# Patient Record
Sex: Male | Born: 1960 | Hispanic: Yes | Marital: Married | Smoking: Former smoker
Health system: Southern US, Community
[De-identification: ages and names within clinical notes are randomized; demographics above are authoritative.]

## PROBLEM LIST (undated history)

## (undated) DIAGNOSIS — Z973 Presence of spectacles and contact lenses: Secondary | ICD-10-CM

## (undated) DIAGNOSIS — I10 Essential (primary) hypertension: Secondary | ICD-10-CM

## (undated) HISTORY — DX: Presence of spectacles and contact lenses: Z97.3

## (undated) HISTORY — DX: Essential (primary) hypertension: I10

---

## 1995-04-13 HISTORY — PX: HERNIA REPAIR: SHX51

## 2001-04-12 HISTORY — PX: VASECTOMY: SHX75

## 2015-05-03 ENCOUNTER — Ambulatory Visit (INDEPENDENT_AMBULATORY_CARE_PROVIDER_SITE_OTHER): Payer: BLUE CROSS/BLUE SHIELD | Admitting: Emergency Medicine

## 2015-05-03 VITALS — BP 138/80 | HR 64 | Temp 98.3°F | Resp 16 | Ht 68.0 in | Wt 215.0 lb

## 2015-05-03 DIAGNOSIS — Z125 Encounter for screening for malignant neoplasm of prostate: Secondary | ICD-10-CM | POA: Diagnosis not present

## 2015-05-03 DIAGNOSIS — Z13220 Encounter for screening for lipoid disorders: Secondary | ICD-10-CM | POA: Diagnosis not present

## 2015-05-03 DIAGNOSIS — Z1329 Encounter for screening for other suspected endocrine disorder: Secondary | ICD-10-CM | POA: Diagnosis not present

## 2015-05-03 DIAGNOSIS — Z0000 Encounter for general adult medical examination without abnormal findings: Secondary | ICD-10-CM | POA: Diagnosis not present

## 2015-05-03 DIAGNOSIS — Z1211 Encounter for screening for malignant neoplasm of colon: Secondary | ICD-10-CM

## 2015-05-03 DIAGNOSIS — I10 Essential (primary) hypertension: Secondary | ICD-10-CM

## 2015-05-03 DIAGNOSIS — Z130 Encounter for screening for diseases of the blood and blood-forming organs and certain disorders involving the immune mechanism: Secondary | ICD-10-CM | POA: Diagnosis not present

## 2015-05-03 DIAGNOSIS — Z1389 Encounter for screening for other disorder: Secondary | ICD-10-CM | POA: Diagnosis not present

## 2015-05-03 DIAGNOSIS — Z23 Encounter for immunization: Secondary | ICD-10-CM | POA: Diagnosis not present

## 2015-05-03 LAB — CBC
HEMATOCRIT: 46.6 % (ref 39.0–52.0)
HEMOGLOBIN: 15.4 g/dL (ref 13.0–17.0)
MCH: 29.2 pg (ref 26.0–34.0)
MCHC: 33 g/dL (ref 30.0–36.0)
MCV: 88.3 fL (ref 78.0–100.0)
MPV: 9.6 fL (ref 8.6–12.4)
Platelets: 267 10*3/uL (ref 150–400)
RBC: 5.28 MIL/uL (ref 4.22–5.81)
RDW: 13.1 % (ref 11.5–15.5)
WBC: 5.1 10*3/uL (ref 4.0–10.5)

## 2015-05-03 LAB — POCT URINALYSIS DIPSTICK
BILIRUBIN UA: NEGATIVE
Blood, UA: NEGATIVE
GLUCOSE UA: NEGATIVE
KETONES UA: NEGATIVE
Leukocytes, UA: NEGATIVE
Nitrite, UA: NEGATIVE
Protein, UA: NEGATIVE
SPEC GRAV UA: 1.02
Urobilinogen, UA: 0.2
pH, UA: 7.5

## 2015-05-03 LAB — LIPID PANEL
CHOL/HDL RATIO: 2.9 ratio (ref <=5.0)
CHOLESTEROL: 116 mg/dL — AB (ref 125–200)
HDL: 40 mg/dL (ref >=40)
LDL Cholesterol: 66 mg/dL (ref <130)
Triglycerides: 51 mg/dL (ref <150)
VLDL: 10 mg/dL (ref <30)

## 2015-05-03 LAB — COMPREHENSIVE METABOLIC PANEL
ALT: 18 U/L (ref 9–46)
AST: 17 U/L (ref 10–35)
Albumin: 4.3 g/dL (ref 3.6–5.1)
Alkaline Phosphatase: 76 U/L (ref 40–115)
BILIRUBIN TOTAL: 0.8 mg/dL (ref 0.2–1.2)
BUN: 10 mg/dL (ref 7–25)
CALCIUM: 9.3 mg/dL (ref 8.6–10.3)
CO2: 29 mmol/L (ref 20–31)
Chloride: 107 mmol/L (ref 98–110)
Creat: 0.98 mg/dL (ref 0.70–1.33)
GLUCOSE: 89 mg/dL (ref 65–99)
Potassium: 4.9 mmol/L (ref 3.5–5.3)
SODIUM: 141 mmol/L (ref 135–146)
Total Protein: 6.8 g/dL (ref 6.1–8.1)

## 2015-05-03 LAB — TSH: TSH: 0.96 u[IU]/mL (ref 0.350–4.500)

## 2015-05-03 LAB — POCT UA - MICROSCOPIC ONLY

## 2015-05-03 MED ORDER — LISINOPRIL 5 MG PO TABS: 5.0000 mg | ORAL_TABLET | Freq: Every day | ORAL | Status: DC

## 2015-05-03 NOTE — Progress Notes (Signed)
Subjective:    Patient ID: Jim [name], male    DOB: [birth year], 55 y.o.   MRN: [id number]  Chief Complaint  Patient presents with  . Annual Exam    Complete Physical   Medications, allergies, past medical history, surgical history, family history, social history and problem list reviewed and updated.  HPI  55 yom presents for CPE.   He works at [employer], has been there about 9 months. He is married with one step son, lives at home with his wife. He does not exercise but is very active daily at work. He does not follow any particular diet. Does eat large amount of vegetables and drinks good amount of water. He drinks rare soda. Does not drinks alcohol, use tobacco, or use illicit drugs. He received his flu vaccine earlier this year.   He denies cp, sob, dizziness, syncope, abd pain, n/v, diarrhea, fevers, or chills. He has never had a colonoscopy. Unsure of his last tdap date but thinks has been many years.   Diagnosed with htn several years ago, has been taking lisinopril for 2-3 years. This is the first time he's seen [place] here.   Review of Systems  ROS negative except those listed below.  Fatigue: Exhausted after 10 hour work days, stable.  Visual problems: Reading glasses.  Urinary frequency: Ongoing for years, thinks he drinks lot of water. Gets up 3 times per night to urinate.  Seasonal allergies: Takes zyrtec/flonase prn which helps.  Headaches: Not every day, periodically. No particular time of day, sometimes after work.  Sleep disturbance: Trouble falling asleep at time when mind is racing. Stable.      Objective:   Physical Exam  Constitutional: He is oriented to person, place, and time. He appears well-developed and well-nourished.  Non-toxic appearance. He does not have a sickly appearance. He does not appear ill. No distress.  BP 138/80 mmHg  Pulse 64  Temp(Src) 98.3 F (36.8 C) (Oral)  Resp 16  Ht 5\' 8"  (1.727 m)  Wt 215 lb (97.523 kg)  BMI 32.70  kg/m2  SpO2 99%   HENT:  Right Ear: Tympanic membrane normal.  Left Ear: Tympanic membrane normal.  Mouth/Throat: Uvula is midline, oropharynx is clear and moist and mucous membranes are normal.  Eyes: Conjunctivae and EOM are normal. Pupils are equal, round, and reactive to light.  Neck: Normal range of motion. No JVD present. No thyroid mass and no thyromegaly present.  Cardiovascular: Normal rate, regular rhythm and normal heart sounds.  Exam reveals no gallop.   No murmur heard. Pulses:      Posterior tibial pulses are 2+ on the right side, and 2+ on the left side.  Pulmonary/Chest: Effort normal and breath sounds normal.  Abdominal: Soft. Normal appearance and bowel sounds are normal. There is no tenderness.  Genitourinary: Prostate normal. Prostate is not enlarged and not tender.  Musculoskeletal: Normal range of motion.  Neurological: He is alert and oriented to person, place, and time. He has normal strength. No cranial nerve deficit or sensory deficit.  Skin: No rash noted.   EKG read by Dr. Everlene Farrier.  Findings: Normal.      Assessment & Plan:   Annual physical exam - Plan: EKG 12-Lead Screening for deficiency anemia - Plan: CBC Screening for nephropathy - Plan: Comprehensive metabolic panel, POCT UA - Microscopic Only, POCT urinalysis dipstick Screening for hyperlipidemia - Plan: Lipid panel Screening for prostate cancer - Plan: PSA --normal physical with no concerning history or exam  findings --checking psa per pt request, he does mention increased urinary freq  Need for Tdap vaccination - Plan: Tdap vaccine greater than or equal to 7yo IM  Essential hypertension - Plan: Comprehensive metabolic panel, POCT UA - Microscopic Only, POCT urinalysis dipstick, EKG 12-Lead, lisinopril (PRINIVIL,ZESTRIL) 5 MG tablet --refilled lisinopril  Special screening for malignant neoplasms, colon - Plan: Ambulatory referral to Gastroenterology  Screening for thyroid disorder - Plan:  TSH  Julieta Gutting, PA-C Physician Assistant-Certified [facility]  05/03/2015 10:37 AM

## 2015-05-03 NOTE — Patient Instructions (Addendum)
We are checking your labs today and will be in touch with you in one week with these results.  We referred you for a colonoscopy today, they'll call you to schedule. You received the tdap vaccine today. This is due every 10 years.  Your EKG was not concerning today.    Health Maintenance, Male A healthy lifestyle and preventative care can promote health and wellness.  Maintain regular health, dental, and eye exams.  Eat a healthy diet. Foods like vegetables, fruits, whole grains, low-fat dairy products, and lean protein foods contain the nutrients you need and are low in calories. Decrease your intake of foods high in solid fats, added sugars, and salt. Get information about a proper diet from your health care provider, if necessary.  Regular physical exercise is one of the most important things you can do for your health. Most adults should get at least 150 minutes of moderate-intensity exercise (any activity that increases your heart rate and causes you to sweat) each week. In addition, most adults need muscle-strengthening exercises on 2 or more days a week.   Maintain a healthy weight. The body mass index (BMI) is a screening tool to identify possible weight problems. It provides an estimate of body fat based on height and weight. Your health care provider can find your BMI and can help you achieve or maintain a healthy weight. For males 20 years and older:  A BMI below 18.5 is considered underweight.  A BMI of 18.5 to 24.9 is normal.  A BMI of 25 to 29.9 is considered overweight.  A BMI of 30 and above is considered obese.  Maintain normal blood lipids and cholesterol by exercising and minimizing your intake of saturated fat. Eat a balanced diet with plenty of fruits and vegetables. Blood tests for lipids and cholesterol should begin at age 65 and be repeated every 5 years. If your lipid or cholesterol levels are high, you are over age 41, or you are at high risk for heart disease, you  may need your cholesterol levels checked more frequently.Ongoing high lipid and cholesterol levels should be treated with medicines if diet and exercise are not working.  If you smoke, find out from your health care provider how to quit. If you do not use tobacco, do not start.  Lung cancer screening is recommended for adults aged 27-80 years who are at high risk for developing lung cancer because of a history of smoking. A yearly low-dose CT scan of the lungs is recommended for people who have at least a 30-pack-year history of smoking and are current smokers or have quit within the past 15 years. A pack year of smoking is smoking an average of 1 pack of cigarettes a day for 1 year (for example, a 30-pack-year history of smoking could mean smoking 1 pack a day for 30 years or 2 packs a day for 15 years). Yearly screening should continue until the smoker has stopped smoking for at least 15 years. Yearly screening should be stopped for people who develop a health problem that would prevent them from having lung cancer treatment.  If you choose to drink alcohol, do not have more than 2 drinks per day. One drink is considered to be 12 oz (360 mL) of beer, 5 oz (150 mL) of wine, or 1.5 oz (45 mL) of liquor.  Avoid the use of street drugs. Do not share needles with anyone. Ask for help if you need support or instructions about stopping the use  of drugs.  High blood pressure causes heart disease and increases the risk of stroke. High blood pressure is more likely to develop in:  People who have blood pressure in the end of the normal range (100-139/85-89 mm Hg).  People who are overweight or obese.  People who are African American.  If you are 8-6 years of age, have your blood pressure checked every 3-5 years. If you are 49 years of age or older, have your blood pressure checked every year. You should have your blood pressure measured twice--once when you are at a hospital or clinic, and once when you  are not at a hospital or clinic. Record the average of the two measurements. To check your blood pressure when you are not at a hospital or clinic, you can use:  An automated blood pressure machine at a pharmacy.  A home blood pressure monitor.  If you are 47-47 years old, ask your health care provider if you should take aspirin to prevent heart disease.  Diabetes screening involves taking a blood sample to check your fasting blood sugar level. This should be done once every 3 years after age 52 if you are at a normal weight and without risk factors for diabetes. Testing should be considered at a younger age or be carried out more frequently if you are overweight and have at least 1 risk factor for diabetes.  Colorectal cancer can be detected and often prevented. Most routine colorectal cancer screening begins at the age of 27 and continues through age 78. However, your health care provider may recommend screening at an earlier age if you have risk factors for colon cancer. On a yearly basis, your health care provider may provide home test kits to check for hidden blood in the stool. A small camera at the end of a tube may be used to directly examine the colon (sigmoidoscopy or colonoscopy) to detect the earliest forms of colorectal cancer. Talk to your health care provider about this at age 30 when routine screening begins. A direct exam of the colon should be repeated every 5-10 years through age 52, unless early forms of precancerous polyps or small growths are found.  People who are at an increased risk for hepatitis B should be screened for this virus. You are considered at high risk for hepatitis B if:  You were born in a country where hepatitis B occurs often. Talk with your health care provider about which countries are considered high risk.  Your parents were born in a high-risk country and you have not received a shot to protect against hepatitis B (hepatitis B vaccine).  You have HIV or  AIDS.  You use needles to inject street drugs.  You live with, or have sex with, someone who has hepatitis B.  You are a man who has sex with other men (MSM).  You get hemodialysis treatment.  You take certain medicines for conditions like cancer, organ transplantation, and autoimmune conditions.  Hepatitis C blood testing is recommended for all people born from [birth year] through 1967 and any individual with known risk factors for hepatitis C.  Healthy men should no longer receive prostate-specific antigen (PSA) blood tests as part of routine cancer screening. Talk to your health care provider about prostate cancer screening.  Testicular cancer screening is not recommended for adolescents or adult males who have no symptoms. Screening includes self-exam, a health care provider exam, and other screening tests. Consult with your health care provider about any symptoms you  have or any concerns you have about testicular cancer.  Practice safe sex. Use condoms and avoid high-risk sexual practices to reduce the spread of sexually transmitted infections (STIs).  You should be screened for STIs, including gonorrhea and chlamydia if:  You are sexually active and are younger than 24 years.  You are older than 24 years, and your health care provider tells you that you are at risk for this type of infection.  Your sexual activity has changed since you were last screened, and you are at an increased risk for chlamydia or gonorrhea. Ask your health care provider if you are at risk.  If you are at risk of being infected with HIV, it is recommended that you take a prescription medicine daily to prevent HIV infection. This is called pre-exposure prophylaxis (PrEP). You are considered at risk if:  You are a man who has sex with other men (MSM).  You are a heterosexual man who is sexually active with multiple partners.  You take drugs by injection.  You are sexually active with a partner who has  HIV.  Talk with your health care provider about whether you are at high risk of being infected with HIV. If you choose to begin PrEP, you should first be tested for HIV. You should then be tested every 3 months for as long as you are taking PrEP.  Use sunscreen. Apply sunscreen liberally and repeatedly throughout the day. You should seek shade when your shadow is shorter than you. Protect yourself by wearing long sleeves, pants, a wide-brimmed hat, and sunglasses year round whenever you are outdoors.  Tell your health care provider of new moles or changes in moles, especially if there is a change in shape or color. Also, tell your health care provider if a mole is larger than the size of a pencil eraser.  A one-time screening for abdominal aortic aneurysm (AAA) and surgical repair of large AAAs by ultrasound is recommended for men aged 85-75 years who are current or former smokers.  Stay current with your vaccines (immunizations).   This information is not intended to replace advice given to you by your health care provider. Make sure you discuss any questions you have with your health care provider.   Document Released: 09/25/2007 Document Revised: 04/19/2014 Document Reviewed: 08/24/2010 Elsevier Interactive Patient Education [employer].

## 2015-05-05 LAB — PSA: PSA: 1.16 ng/mL (ref <=4.00)

## 2016-01-30 ENCOUNTER — Ambulatory Visit (INDEPENDENT_AMBULATORY_CARE_PROVIDER_SITE_OTHER): Payer: BLUE CROSS/BLUE SHIELD | Admitting: Physician Assistant

## 2016-01-30 ENCOUNTER — Ambulatory Visit (INDEPENDENT_AMBULATORY_CARE_PROVIDER_SITE_OTHER): Payer: BLUE CROSS/BLUE SHIELD

## 2016-01-30 VITALS — BP 132/80 | HR 87 | Temp 98.2°F | Resp 18 | Ht 68.0 in | Wt 224.0 lb

## 2016-01-30 DIAGNOSIS — M25552 Pain in left hip: Secondary | ICD-10-CM | POA: Diagnosis not present

## 2016-01-30 DIAGNOSIS — I10 Essential (primary) hypertension: Secondary | ICD-10-CM | POA: Insufficient documentation

## 2016-01-30 MED ORDER — CYCLOBENZAPRINE HCL 10 MG PO TABS: 5.0000 mg | ORAL_TABLET | Freq: Every day | ORAL | 0 refills | Status: DC

## 2016-01-30 MED ORDER — MELOXICAM 15 MG PO TABS: 15.0000 mg | ORAL_TABLET | Freq: Every day | ORAL | 1 refills | Status: DC

## 2016-01-30 NOTE — Patient Instructions (Addendum)
There is mild osteoarthritis in both your hips, which may be the cause of your symptoms. If the medications I have prescribed are not effective, please let me know, and I will refer you to orthopedics for additional evaluation and treatment.    IF you received an x-ray today, you will receive an invoice from [facility]. Please contact [facility] at [phone] with questions or concerns regarding your invoice.   IF you received labwork today, you will receive an invoice from [employer]. Please contact [facility] at [phone] with questions or concerns regarding your invoice.   Our billing staff will not be able to assist you with questions regarding bills from these companies.  You will be contacted with the lab results as soon as they are available. The fastest way to get your results is to activate your My Chart account. Instructions are located on the last page of this paperwork. If you have not heard from [place] regarding the results in 2 weeks, please contact this office.

## 2016-01-30 NOTE — Progress Notes (Signed)
Subjective:    Patient ID: Jim [name], male    DOB: 1960/11/03, 55 y.o.   MRN: [id number]  Chief Complaint  Patient presents with  . Groin Pain  . Hip Pain   HPI: Presents with complain of sharp, "needle-like" pain in left groin radiating down to popliteal fossa. States it is a waxing and waning pain which began about a month ago, without any inciting traumatic injury or event, worsening the past 2 weeks, especially with walking. States when it comes on it usually lasts a few hours and Motrin can sometimes provide some relief. 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 Notes some associated tingling but denies numbness. Denies swelling, erythema, warmth, or signs of infection in the area. Denies associated back pain, bowel or bladder dysfunction or complaints. Of note, pt does have a history of a left inguinal hernia repair in 1997. States this pain is in a different area and characterizes it differently compared to the hernia. Otherwise, PMH only significant for HTN for which he takes Lisinopril daily.  No Known Allergies  Patient Active Problem List   Diagnosis Date Noted  . Benign essential HTN 01/30/2016   Prior to Admission medications   Medication Sig Start Date End Date Taking? Authorizing Provider  lisinopril (PRINIVIL,ZESTRIL) 5 MG tablet Take 1 tablet (5 mg total) by mouth daily. 05/03/15  Yes Araceli Bouche, PA   Review of Systems Prevalent review of systems negative other than what is mentioned above in HPI.    Objective:   Physical Exam General: Well-developed, well-nourished, appears stated age and in no apparent distress. HEENT: Normocephalic, atraumatic. Eyes PERRLA, sclera and conjunctiva clear without injection or icterus. Supple. No thyromegaly or lymphadenopathy.  Pulmonary: Clear to auscultation bilaterally, no wheezes, rhonchi, or rales. No cyanosis  or clubbing. Cardiovascular: Regular rate and rhythm with normal S1 and S2 without murmurs, rubs, or gallops. Popliteal, posterior tibialis, and dorsalis pedis pulses 2+ bilaterally. Neurological: Awake, alert, oriented. DTRs 2+ bilaterally at patella and Achilles. Sensation equal bilaterally in lower extremity. Grip strength 5/5 bilaterally. Musculoskeletal: Non-tender to palpation along lumbar spine, lower back, groin and along outer thigh to popliteal fossa,  Hips: Right and left hips with full ROM while lying down, flexion, extension, abduction, adduction, internal and external rotation performed passively appropriately. Pain noted in site at groin with adduction, extension and flexion of leg against resistance. Strength 5/5 bilaterally.  Knees: Full ROM, flexion and extension performed passively and against resistance without pain. Spine: Full ROM, flexion, extension, lateral rotation, right and left lateral flexion performed passively without pain. Pain noted in spot in groin with radiation down thigh with tandem gait and heel-to-toe gait.  Skin: Skin warm and dry. Small ingrown follicle noted near upper left groin. Psychiatric: Appropriate mood and affect. Fluent speech and normal behavior.  Dg Hip Unilat W Or W/o Pelvis 2-3 Views Left  Result Date: 01/30/2016 CLINICAL DATA:  55 year old male with history of pain in the left groin radiating to the posterior aspect of the left knee. EXAM: DG HIP (WITH OR WITHOUT PELVIS) 2-3V LEFT COMPARISON:  No priors. FINDINGS: There is no evidence of hip fracture or dislocation. Mild joint space narrowing, subchondral sclerosis and osteophyte formation is noted in the hip joints bilaterally, compatible with mild osteoarthritis. Surgical clips project over the scrotum bilaterally, likely from prior vasectomy. IMPRESSION: 1. No acute radiographic  abnormality of the bony pelvis or the left hip. 2. Mild bilateral hip joint osteoarthritis. Electronically Signed    By: Vinnie Langton M.D.   On: 01/30/2016 17:18      Assessment & Plan:  1. Pain of left hip joint X-ray of hip and pelvis without evidence of fracture or dislocation but did demonstrate some osteoarthritis in bilateral hip joints. Advised patient to stop taking Motrin at home. Prescription of Flexeril 10 mg and Mobic 15 mg to be used as needed for pain. Discussed sedating effects of Flexeril and advised to take at night. Instructed to follow-up with our office or see an orthopedic specialist if pain is not resolved by these medications.   - cyclobenzaprine (FLEXERIL) 10 MG tablet; Take 0.5-1 tablets (5-10 mg total) by mouth at bedtime.  Dispense: 30 tablet; Refill: 0 - meloxicam (MOBIC) 15 MG tablet; Take 1 tablet (15 mg total) by mouth daily.  Dispense: 30 tablet; Refill: 1

## 2016-01-30 NOTE — Progress Notes (Signed)
Patient ID: Jim [name], male    DOB: Aug 23, 1960, 55 y.o.   MRN: [id number]  PCP: No primary care provider on file.  Subjective:   Chief Complaint  Patient presents with  . Groin Pain  . Hip Pain    HPI Presents for evaluation of pain in the RIGHT groin/hip that radiates to the posterior RIGHT knee.  Symptoms have been intermittent x 1 month, but have been worsening for the past 2 weeks. OTC NSAIDS are no longer effective. No tenderness, but pain with some movements and with weight bearing.  No trauma or injury. The pain is described as "needle-like." No back pain. No loss of bowel/bladder control. No weakness of the leg, no giving way. Some associated tingling in the leg, but no loss of sensation.  History of LEFT inguinal hernia repair in 1997.    Review of Systems As above.    There are no active problems to display for this patient.    Prior to Admission medications   Medication Sig Start Date End Date Taking? Authorizing Provider  lisinopril (PRINIVIL,ZESTRIL) 5 MG tablet Take 1 tablet (5 mg total) by mouth daily. 05/03/15  Yes Todd McVeigh, PA     No Known Allergies     Objective:  Physical Exam  Constitutional: He is oriented to person, place, and time. He appears well-developed and well-nourished. He is active and cooperative. No distress.  BP 132/80 (BP Location: Right Arm, Patient Position: Sitting, Cuff Size: Small)   Pulse 87   Temp 98.2 F (36.8 C) (Oral)   Resp 18   Ht 5\' 8"  (1.727 m)   Wt 224 lb (101.6 kg)   SpO2 100%   BMI 34.06 kg/m   HENT:  Head: Normocephalic and atraumatic.  Right Ear: Hearing normal.  Left Ear: Hearing normal.  Eyes: Conjunctivae are normal. No scleral icterus.  Neck: Normal range of motion. Neck supple. No thyromegaly present.  Cardiovascular: Normal rate, regular rhythm and normal heart sounds.   Pulses:      Radial pulses are 2+ on the right side, and 2+ on the left side.  Pulmonary/Chest: Effort  normal and breath sounds normal.  Musculoskeletal:       Right hip: Normal.       Left hip: He exhibits normal range of motion, normal strength, no tenderness, no bony tenderness, no swelling, no crepitus, no deformity and no laceration.       Lumbar back: Normal.       Legs: Pain with weight bearing and with ADduction. Pain with flexion against resistance.   Lymphadenopathy:       Head (right side): No tonsillar, no preauricular, no posterior auricular and no occipital adenopathy present.       Head (left side): No tonsillar, no preauricular, no posterior auricular and no occipital adenopathy present.    He has no cervical adenopathy.       Right: No supraclavicular adenopathy present.       Left: No supraclavicular adenopathy present.  Neurological: He is alert and oriented to person, place, and time. He has normal strength. No sensory deficit.  Reflex Scores:      Patellar reflexes are 2+ on the right side and 2+ on the left side.      Achilles reflexes are 2+ on the right side and 2+ on the left side. Skin: Skin is warm, dry and intact. No rash noted. No cyanosis or erythema. Nails show no clubbing.  Psychiatric: He has a normal mood  and affect. His speech is normal and behavior is normal.      Dg Hip Unilat W Or W/o Pelvis 2-3 Views Left  Result Date: 01/30/2016 CLINICAL DATA:  55 year old male with history of pain in the left groin radiating to the posterior aspect of the left knee. EXAM: DG HIP (WITH OR WITHOUT PELVIS) 2-3V LEFT COMPARISON:  No priors. FINDINGS: There is no evidence of hip fracture or dislocation. Mild joint space narrowing, subchondral sclerosis and osteophyte formation is noted in the hip joints bilaterally, compatible with mild osteoarthritis. Surgical clips project over the scrotum bilaterally, likely from prior vasectomy. IMPRESSION: 1. No acute radiographic abnormality of the bony pelvis or the left hip. 2. Mild bilateral hip joint osteoarthritis.  Electronically Signed   By: Vinnie Langton M.D.   On: 01/30/2016 17:18        Assessment & Plan:   1. Pain of left hip joint Mild OA of both hips may be the source of this pain. Trial of meloxicam and cyclobenzaprine. Stop OTC NSAIDS. If not improving, plan referral to orthopedics. - DG HIP UNILAT W OR W/O PELVIS 2-3 VIEWS LEFT; Future - cyclobenzaprine (FLEXERIL) 10 MG tablet; Take 0.5-1 tablets (5-10 mg total) by mouth at bedtime.  Dispense: 30 tablet; Refill: 0 - meloxicam (MOBIC) 15 MG tablet; Take 1 tablet (15 mg total) by mouth daily.  Dispense: 30 tablet; Refill: 1   Fara Chute, PA-C Physician Assistant-Certified [facility]

## 2016-06-18 ENCOUNTER — Ambulatory Visit: Payer: BLUE CROSS/BLUE SHIELD

## 2016-06-21 ENCOUNTER — Encounter: Payer: Self-pay | Admitting: Pulmonary Disease

## 2016-06-21 ENCOUNTER — Ambulatory Visit (INDEPENDENT_AMBULATORY_CARE_PROVIDER_SITE_OTHER): Payer: BLUE CROSS/BLUE SHIELD | Admitting: Pulmonary Disease

## 2016-06-21 DIAGNOSIS — Z77090 Contact with and (suspected) exposure to asbestos: Secondary | ICD-10-CM

## 2016-06-21 NOTE — Assessment & Plan Note (Addendum)
Based on my findings, I have not detected any medical conditions that would place the employee at an increased risk of health impairment from exposure to asbestos,. I would recommend the continued use of personal protective equipment such as respirators and clothing.  I have informed him of medical conditions that may result from asbestos exposure such as calcifications, fibrosis and cancers  He can continue annual surveillance with PCP

## 2016-06-21 NOTE — Patient Instructions (Addendum)
Letter for clearance will be sent to referring physician Follow-up as needed

## 2016-06-21 NOTE — Progress Notes (Signed)
Subjective:    Patient ID: Jim [name], male    DOB: [birth year], 58 y.o.   MRN: [id number]  HPI   56 year old nonsmoker presents for evaluation and recommendation regarding expressed as exposure. He underwent his annual physical at urgent care and was referred to [place].  His employer is [employer] He has been working for them for the last 2 years, about 30 hours a week. He has been living in [place] for about 5 years and reports seasonal allergies. He is of [place] origin and has lived in [place] in [place] in [place]. His allergies have been worse since going to [place]. He uses Claritin over-the-counter for this. He smoked for less than 10 pack years in his 56s and quit at age 41. 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 He is otherwise in good health except for hypertension which is controlled with lisinopril. He denies frequent chest colds, distant exertion or wheezing. He is able to wear his respirator for a long period of time. There is no family history of lung cancer and denies a chronic cough or sinus drainage or or wheezing  Spirometry showed normal lung function with FEV1 of 99% and FVC of 91% with ratio of 85. Chest x-ray showed minimal subsegmental atelectasis of the left lung base. No pleural calcifications were noted. Blood lead and zinc levels were normal   Past Medical History:  Diagnosis Date  . Hypertension     Past Surgical History:  Procedure Laterality Date  . HERNIA REPAIR Left 1997  . VASECTOMY  2003     No Known Allergies   Social History   Social History  . Marital status: Married    Spouse name: N/A  . Number of children: N/A  . Years of education: N/A   Occupational History  . demolition    Social History Main Topics  . Smoking status: Never Smoker  . Smokeless tobacco: Never Used  . Alcohol use No  . Drug use: No  . Sexual activity: Yes    Birth  control/ protection: None   Other Topics Concern  . Not on file   Social History Narrative  . No narrative on file     Review of Systems Positive for acid heartburn, headaches, nasal congestion especially seasonal  Constitutional: negative for anorexia, fevers and sweats  Eyes: negative for irritation, redness and visual disturbance  Ears, nose, mouth, throat, and face: negative for earaches, epistaxis, nasal congestion and sore throat  Respiratory: negative for cough, dyspnea on exertion, sputum and wheezing  Cardiovascular: negative for chest pain, dyspnea, lower extremity edema, orthopnea, palpitations and syncope  Gastrointestinal: negative for abdominal pain, constipation, diarrhea, melena, nausea and vomiting  Genitourinary:negative for dysuria, frequency and hematuria  Hematologic/lymphatic: negative for bleeding, easy bruising and lymphadenopathy  Musculoskeletal:negative for arthralgias, muscle weakness and stiff joints  Neurological: negative for coordination problems, gait problems, headaches and weakness  Endocrine: negative for diabetic symptoms including polydipsia, polyuria and weight loss     Objective:   Physical Exam   Gen. Pleasant, well-nourished, in no distress, normal affect ENT - no lesions, no post nasal drip Neck: No JVD, no thyromegaly, no carotid bruits Lungs: no use of accessory muscles, no dullness to percussion, clear without rales or rhonchi  Cardiovascular: Rhythm regular, heart sounds  normal, no murmurs or gallops, no peripheral edema Abdomen: soft and non-tender, no hepatosplenomegaly, BS normal. Musculoskeletal: No deformities, no cyanosis or  clubbing Neuro:  alert, non focal        Assessment & Plan:

## 2016-12-27 ENCOUNTER — Encounter: Payer: Self-pay | Admitting: Physician Assistant

## 2016-12-27 ENCOUNTER — Ambulatory Visit (INDEPENDENT_AMBULATORY_CARE_PROVIDER_SITE_OTHER): Payer: BLUE CROSS/BLUE SHIELD | Admitting: Physician Assistant

## 2016-12-27 VITALS — BP 134/84 | HR 69 | Temp 98.1°F | Resp 16 | Ht 67.5 in | Wt 219.6 lb

## 2016-12-27 DIAGNOSIS — R0683 Snoring: Secondary | ICD-10-CM

## 2016-12-27 DIAGNOSIS — I10 Essential (primary) hypertension: Secondary | ICD-10-CM | POA: Diagnosis not present

## 2016-12-27 MED ORDER — LISINOPRIL 5 MG PO TABS: 5.0000 mg | ORAL_TABLET | Freq: Every day | ORAL | 11 refills | Status: DC

## 2016-12-27 NOTE — Patient Instructions (Signed)
You will receive a phone call to sched an appointment with sleep study Come back and see me in one month for annual exam.  Take a few blood pressure readings at home between now and when you come back.    Thank you for coming in today. I hope you feel we met your needs.  Feel free to call PCP if you have any questions or further requests.  Please consider signing up for MyChart if you do not already have it, as this is a great way to communicate with me.  Best,  Whitney Alencia Gordon, PA-C  Sleep Apnea Sleep apnea is a condition that affects breathing. People with sleep apnea have moments during sleep when their breathing pauses briefly or gets shallow. Sleep apnea can cause these symptoms:  Trouble staying asleep.  Sleepiness or tiredness during the day.  Irritability.  Loud snoring.  Morning headaches.  Trouble concentrating.  Forgetting things.  Less interest in sex.  Being sleepy for no reason.  Mood swings.  Personality changes.  Depression.  Waking up a lot during the night to pee (urinate).  Dry mouth.  Sore throat.  Follow these instructions at home:  Make any changes in your routine that your doctor recommends.  Eat a healthy, well-balanced diet.  Take over-the-counter and prescription medicines only as told by your doctor.  Avoid using alcohol, calming medicines (sedatives), and narcotic medicines.  Take steps to lose weight if you are overweight.  If you were given a machine (device) to use while you sleep, use it only as told by your doctor.  Do not use any tobacco products, such as cigarettes, chewing tobacco, and e-cigarettes. If you need help quitting, ask your doctor.  Keep all follow-up visits as told by your doctor. This is important. Contact a doctor if:  The machine that you were given to use during sleep is uncomfortable or does not seem to be working.  Your symptoms do not get better.  Your symptoms get worse. Get help right away  if:  Your chest hurts.  You have trouble breathing in enough air (shortness of breath).  You have an uncomfortable feeling in your back, arms, or stomach.  You have trouble talking.  One side of your body feels weak.  A part of your face is hanging down (drooping). These symptoms may be an emergency. Do not wait to see if the symptoms will go away. Get medical help right away. Call your local emergency services (911 in the [place].S.). Do not drive yourself to the hospital. This information is not intended to replace advice given to you by your health care provider. Make sure you discuss any questions you have with your health care provider. Document Released: 01/06/2008 Document Revised: 11/23/2015 Document Reviewed: 01/06/2015 Elsevier Interactive Patient Education  2018 [employer].  Sleep Studies A sleep study (polysomnogram) is a series of tests done while you are sleeping. It can show how well you sleep. This can help your health care provider diagnose a sleep disorder and show how severe your sleep disorder is. A sleep study may lead to treatment that will help you sleep better and prevent other medical problems caused by poor sleep. If you have a sleep disorder, you may also be at risk for:  Sleep-related accidents.  High blood pressure.  Heart disease.  Stroke.  Other medical conditions.  Sleep disorders are common. Your health care provider may suspect a sleep disorder if you:  Have loud snoring most nights.  Have  brief periods when you stop breathing at night.  Feel sleepy on most days.  Fall asleep suddenly during the day.  Have trouble falling asleep or staying asleep.  Feel like you need to move your legs when trying to fall asleep.  Have dreams that seem very real shortly after falling asleep.  Feel like you cannot move when you first wake up.  Which tests will I need to have? Most sleep studies last all night and include these tests:  Recordings of  your brain activity.  Recordings of your eye movements.  Recording of your heart rate and rhythm.  Blood pressure readings.  Readings of the amount of oxygen in your blood.  Measurements of your chest and belly movement as you breathe during sleep.  If you have signs of the sleep disorder called sleep apnea during your test, you may get a mask to wear for the second half of the night.  The mask provides continuous positive airway pressure (CPAP). This may improve sleep apnea significantly.  You will then have all tests done again with the mask in place to see if your measurements and recordings change.  How are sleep studies done? Most sleep studies are done over one full night of sleep.  You will arrive at the study center in the evening and can go home in the morning.  Bring your pajamas and toothbrush.  Do not have caffeine on the day of your sleep study.  Your health care provider will let you know if you need to stop taking any of your regular medicines before the test.  To do the tests included in a polysomnogram, you will have:  Round, sticky patches with sensors attached to recording wires (electrodes) placed on your scalp, face, chest, and limbs.  Wires from all the electrodes and sensors run from your bed to a computer. The wires can be taken off and put back on if you need to get out of bed to go to the bathroom.  A sensor placed over your nose to measure airflow.  A finger clip put on one finger to measure your blood oxygen level.  A belt around your belly and a belt around your chest to measure breathing movements.  Where are sleep studies done? Sleep studies are done at sleep centers. A sleep center may be inside a hospital, office, or clinic. The room where you have the study may look like a hospital room or a hotel room. The health care providers doing the study may come in and out of the room during the study. Most of the time, they will be in another room  monitoring your test. How is information from sleep studies helpful? A polysomnogram can be used along with your medical history and a physical exam to diagnose conditions, such as:  Sleep apnea.  Restless legs syndrome.  Sleep-related seizure disorders.  Sleep-related movement disorders.  A medical doctor who specializes in sleep will evaluate your sleep study. The specialist will share the results with your primary health care provider. Treatments based on your sleep study may include:  Improving your sleep habits (sleep hygiene).  Wearing a CPAP mask.  Wearing an oral device at night to improve breathing and reduce snoring.  Taking medicine for: ? Restless legs syndrome. ? Sleep-related seizure disorder. ? Sleep-related movement disorder.  This information is not intended to replace advice given to you by your health care provider. Make sure you discuss any questions you have with your health care provider.  Document Released: 10/03/2002 Document Revised: 11/23/2015 Document Reviewed: 06/04/2013 Elsevier Interactive Patient Education  [employer].

## 2016-12-27 NOTE — Progress Notes (Signed)
   Jim [name]  MRN: [id number] DOB: [birth year]  PCP: Harrison Mons, PA-C  Subjective:  Pt is a pleasant 56 year old male who presents to clinic for medication refill and headache x 2 days.   HTN - blood pressure today is 146/87. Recheck is 134/84. He takes Lisinopril 5mg  qd. He needs refill as he is out of this medication - Last dose was last month. Denies medication side effects.  Home blood pressures 138/80. His last OV was >1 year ago.  Denies chest pain, palpitations, visual changes, leg swelling.   Headaches x 2 days. Endorses "snoring so bad". Has gotten worse recently.  His wife is complaining about it. He has woken up in the morning with headache. Occasional daytime somnolence and/or fatigue. He is interesting in a sleeping mask.    Review of Systems  Constitutional: Negative for chills, diaphoresis, fatigue and fever.  Eyes: Negative for photophobia and visual disturbance.  Gastrointestinal: Negative for nausea and vomiting.  Neurological: Positive for headaches. Negative for dizziness, syncope and light-headedness.  Psychiatric/Behavioral: Positive for sleep disturbance (snoring).    Patient Active Problem List   Diagnosis Date Noted  . Asbestos exposure 06/21/2016  . Benign essential HTN 01/30/2016    Current Outpatient Prescriptions on File Prior to Visit  Medication Sig Dispense Refill  . lisinopril (PRINIVIL,ZESTRIL) 5 MG tablet Take 1 tablet (5 mg total) by mouth daily. 30 tablet 11   No current facility-administered medications on file prior to visit.     No Known Allergies   Objective:  BP (!) 146/87   Pulse 69   Temp 98.1 F (36.7 C) (Oral)   Resp 16   Ht 5' 7.5" (1.715 m)   Wt 219 lb 9.6 oz (99.6 kg)   SpO2 98%   BMI 33.89 kg/m   Physical Exam  Constitutional: He is oriented to person, place, and time and well-developed, well-nourished, and in no distress. No distress.  Neck: Carotid bruit is not present.  Neck circumference 17 in    Cardiovascular: Normal rate, regular rhythm and normal heart sounds.   Neurological: He is alert and oriented to person, place, and time. GCS score is 15.  Skin: Skin is warm and dry.  Psychiatric: Mood, memory, affect and judgment normal.  Vitals reviewed.   Assessment and Plan :  1. Essential hypertension - lisinopril (PRINIVIL,ZESTRIL) 5 MG tablet; Take 1 tablet (5 mg total) by mouth daily.  Dispense: 30 tablet; Refill: 11 - OK to refill. He is not fasting today. RTC in 4-6 weeks for annual exam. Plan to check labs at that [place]. HTN and headaches possibly due to OSA - will get sleep study for evaluation.  2. Snoring - Ambulatory referral to Sleep Studies  Mercer Pod, PA-C  [facility] 12/27/2016 10:36 AM

## 2017-02-02 ENCOUNTER — Ambulatory Visit (INDEPENDENT_AMBULATORY_CARE_PROVIDER_SITE_OTHER): Payer: BLUE CROSS/BLUE SHIELD | Admitting: Neurology

## 2017-02-02 ENCOUNTER — Encounter: Payer: Self-pay | Admitting: Neurology

## 2017-02-02 VITALS — BP 143/87 | HR 79 | Ht 68.0 in | Wt 224.0 lb

## 2017-02-02 DIAGNOSIS — G2581 Restless legs syndrome: Secondary | ICD-10-CM

## 2017-02-02 DIAGNOSIS — R0681 Apnea, not elsewhere classified: Secondary | ICD-10-CM | POA: Diagnosis not present

## 2017-02-02 DIAGNOSIS — G4719 Other hypersomnia: Secondary | ICD-10-CM | POA: Diagnosis not present

## 2017-02-02 DIAGNOSIS — R351 Nocturia: Secondary | ICD-10-CM | POA: Diagnosis not present

## 2017-02-02 DIAGNOSIS — R519 Headache, unspecified: Secondary | ICD-10-CM

## 2017-02-02 DIAGNOSIS — R0683 Snoring: Secondary | ICD-10-CM | POA: Diagnosis not present

## 2017-02-02 DIAGNOSIS — E669 Obesity, unspecified: Secondary | ICD-10-CM

## 2017-02-02 DIAGNOSIS — R51 Headache: Secondary | ICD-10-CM | POA: Diagnosis not present

## 2017-02-02 NOTE — Progress Notes (Signed)
Subjective:    Patient ID: Jim [name] is a 56 y.o. male.  HPI     Jim Age, MD, PhD [facility] [address].O. [place], [place] [address]  Dear Jim [name],   I saw your patient, Jim [name], upon your kind request, in my neurologica clinic today for initial consultation of his sleep disorder, in particular, concern for underlying obstructive sleep apnea. The patient is accompanied by his wife today. As you know, Jim [name] is a 75 year old right-handed gentleman with an underlying medical history of hypertension, and obesity, who reports snoring and excessive daytime somnolence as well as morning headaches. I reviewed your office note from 12/27/2016. His wife notices loud snoring at times also witnessed apneas are reported per her description. He has woken himself up with a snort or choking sensation. His Epworth sleepiness score is 15 out of 24 today, fatigue score is 41 out of 63. He is married and lives with his wife (he has been married 6 years). He has a Environmental consultant, [name] 58 yo. He quit smoking about 35 years ago and does not utilize alcohol, maybe once a year. He drinks caffeine in the form of coffee, about 3 cups per day.  He has a 69 yo daughter from before and sadly recently lost his son at [name] 18.  Bedtime is around 9 PM, WT is about 5 AM. He has significant nocturia about 3-4 times per night. He has had morning headaches. He has some RLS symptoms at times.  He works in Teacher, music.   His Past Medical History Is Significant For: Past Medical History:  Diagnosis Date  . Hypertension     His Past Surgical History Is Significant For: Past Surgical History:  Procedure Laterality Date  . HERNIA REPAIR Left 1997  . VASECTOMY  2003    His Family History Is Significant For: Family History  Problem Relation [name] of Onset  . Hyperlipidemia Mother   . Hypertension Mother   . Hypertension Father   . Prostate cancer  Paternal Grandfather     His Social History Is Significant For: Social History   Social History  . Marital status: Married    Spouse name: N/A  . Number of children: N/A  . Years of education: N/A   Occupational History  . demolition    Social History Main Topics  . Smoking status: Never Smoker  . Smokeless tobacco: Never Used  . Alcohol use No  . Drug use: No  . Sexual activity: Yes    Birth control/ protection: None   Other Topics Concern  . None   Social History Narrative  . None    His Allergies Are:  No Known Allergies:   His Current Medications Are:  Outpatient Encounter Prescriptions as of 02/02/2017  Medication Sig  . lisinopril (PRINIVIL,ZESTRIL) 5 MG tablet Take 1 tablet (5 mg total) by mouth daily.  . tamsulosin (FLOMAX) 0.4 MG CAPS capsule Take 0.4 mg by mouth.   No facility-administered encounter medications on file as of 02/02/2017.   :  Review of Systems:  Out of a complete 14 point review of systems, all are reviewed and negative with the exception of these symptoms as listed below: Review of Systems  Neurological:       Pt presents today to discuss his sleep. Pt has never had a sleep study but does endorse snoring.  Epworth Sleepiness Scale 0= would never doze 1= slight chance of dozing 2= moderate chance of dozing 3=  high chance of dozing  Sitting and reading: 2 Watching TV: 2 Sitting inactive in a public place (ex. Theater or meeting): 1 As a passenger in a car for an hour without a break: 3 Lying down to rest in the afternoon: 2 Sitting and talking to someone: 2 Sitting quietly after lunch (no alcohol): 2 In a car, while stopped in traffic: 1 Total: 15     Objective:  Neurological Exam  Physical Exam Physical Examination:   Vitals:   02/02/17 1619  BP: (!) 143/87  Pulse: 79   General Examination: The patient is a very pleasant 55 y.o. male in no acute distress. He appears well-developed and well-nourished and well  groomed.   HEENT: Normocephalic, atraumatic, pupils are equal, round and reactive to light and accommodation. Funduscopic exam is normal with sharp disc margins noted. Extraocular tracking is good without limitation to gaze excursion or nystagmus noted. Normal smooth pursuit is noted. Hearing is grossly intact. Tympanic membranes are clear bilaterally. Face is symmetric with normal facial animation and normal facial sensation. Speech is clear with no dysarthria noted. There is no hypophonia. There is no lip, neck/head, jaw or voice tremor. Neck is supple with full range of passive and active motion. There are no carotid bruits on auscultation. Oropharynx exam reveals: mild mouth dryness, adequate dental hygiene and moderate airway crowding, due to larger uvula and tonsils in place, tongue thicker. Mallampati is class II. Tongue protrudes centrally and palate elevates symmetrically. Tonsils are 1-2+ in size. Neck size is 16 5/8 inches. He has a Mild overbite.   Chest: Clear to auscultation without wheezing, rhonchi or crackles noted.  Heart: S1+S2+0, regular and normal without murmurs, rubs or gallops noted.   Abdomen: Soft, non-tender and non-distended with normal bowel sounds appreciated on auscultation.  Extremities: There is no pitting edema in the distal lower extremities bilaterally. Pedal pulses are intact.  Skin: Warm and dry without trophic changes noted.  Musculoskeletal: exam reveals no obvious joint deformities, tenderness or joint swelling or erythema.   Neurologically:  Mental status: The patient is awake, alert and oriented in all 4 spheres. His immediate and remote memory, attention, language skills and fund of knowledge are appropriate. There is no evidence of aphasia, agnosia, apraxia or anomia. Speech is clear with normal prosody and enunciation. Thought process is linear. Mood is normal and affect is normal.  Cranial nerves II - XII are as described above under HEENT exam. In  addition: shoulder shrug is normal with equal shoulder height noted. Motor exam: Normal bulk, strength and tone is noted. There is no drift, tremor or rebound. Romberg is negative. Reflexes are 2+ throughout. Fine motor skills and coordination: intact with normal finger taps, normal hand movements, normal rapid alternating patting, normal foot taps and normal foot agility.  Cerebellar testing: No dysmetria or intention tremor on finger to nose testing. Heel to shin is unremarkable bilaterally. There is no truncal or gait ataxia.  Sensory exam: intact to light touch in the upper and lower extremities.  Gait, station and balance: He stands easily. No veering to one side is noted. No leaning to one side is noted. Posture is [name]-appropriate and stance is narrow based. Gait shows normal stride length and normal pace. No problems turning are noted. Tandem walk is unremarkable.   Assessment and Plan:  In summary, Trevell Pariseau is a very pleasant 56 y.o.-year old male with an underlying medical history of hypertension, and obesity, whose history and physical exam are concerning for  obstructive sleep apnea (OSA). I had a long chat with the patient and his wife about my findings and the diagnosis of OSA, its prognosis and treatment options. We talked about medical treatments, surgical interventions and non-pharmacological approaches. I explained in particular the risks and ramifications of untreated moderate to severe OSA, especially with respect to developing cardiovascular disease down the Road, including congestive heart failure, difficult to treat hypertension, cardiac arrhythmias, or stroke. Even type 2 diabetes has, in part, been linked to untreated OSA. Symptoms of untreated OSA include daytime sleepiness, memory problems, mood irritability and mood disorder such as depression and anxiety, lack of energy, as well as recurrent headaches, especially morning headaches. We talked about trying to maintain a  healthy lifestyle in general, as well as the importance of weight control. I encouraged the patient to eat healthy, exercise daily and keep well hydrated, to keep a scheduled bedtime and wake time routine, to not skip any meals and eat healthy snacks in between meals. I advised the patient not to drive when feeling sleepy. I recommended the following at this time: sleep study with potential positive airway pressure titration. (We will score hypopneas at 3%).   I explained the sleep test procedure to the patient and also outlined possible surgical and non-surgical treatment options of OSA, including the use of a custom-made dental device (which would require a referral to a specialist dentist or oral surgeon), upper airway surgical options, such as pillar implants, radiofrequency surgery, tongue base surgery, and UPPP (which would involve a referral to an ENT surgeon). Rarely, jaw surgery such as mandibular advancement may be considered.  I also explained the CPAP treatment option to the patient, who indicated that he would be willing to try CPAP if the need arises. I explained the importance of being compliant with PAP treatment, not only for insurance purposes but primarily to improve His symptoms, and for the patient's long term health benefit, including to reduce His cardiovascular risks. I answered all their questions today and the patient and his wife were in agreement. I would like to see him back after the sleep study is completed and encouraged him to call with any interim questions, concerns, problems or updates.   Thank you very much for allowing me to participate in the care of this nice patient. If I can be of any further assistance to you please do not hesitate to call me at [phone].  Sincerely,   Jim Age, MD, PhD

## 2017-02-02 NOTE — Patient Instructions (Signed)

## 2017-04-23 ENCOUNTER — Encounter: Payer: BLUE CROSS/BLUE SHIELD | Admitting: Physician Assistant

## 2017-07-18 ENCOUNTER — Encounter: Payer: Self-pay | Admitting: Physician Assistant

## 2017-11-07 ENCOUNTER — Ambulatory Visit: Payer: BLUE CROSS/BLUE SHIELD | Admitting: Family Medicine

## 2017-11-07 ENCOUNTER — Encounter: Payer: Self-pay | Admitting: Family Medicine

## 2017-11-07 ENCOUNTER — Other Ambulatory Visit: Payer: Self-pay

## 2017-11-07 ENCOUNTER — Ambulatory Visit (INDEPENDENT_AMBULATORY_CARE_PROVIDER_SITE_OTHER): Payer: BLUE CROSS/BLUE SHIELD

## 2017-11-07 VITALS — BP 138/76 | HR 82 | Temp 98.3°F | Ht 68.0 in | Wt 235.4 lb

## 2017-11-07 DIAGNOSIS — M7989 Other specified soft tissue disorders: Secondary | ICD-10-CM | POA: Diagnosis not present

## 2017-11-07 DIAGNOSIS — M25571 Pain in right ankle and joints of right foot: Secondary | ICD-10-CM | POA: Diagnosis not present

## 2017-11-07 DIAGNOSIS — M79661 Pain in right lower leg: Secondary | ICD-10-CM

## 2017-11-07 DIAGNOSIS — Z789 Other specified health status: Secondary | ICD-10-CM | POA: Diagnosis not present

## 2017-11-07 DIAGNOSIS — L299 Pruritus, unspecified: Secondary | ICD-10-CM

## 2017-11-07 MED ORDER — TRIAMCINOLONE ACETONIDE 0.1 % EX CREA: 1.0000 "application " | TOPICAL_CREAM | Freq: Two times a day (BID) | CUTANEOUS | 0 refills | Status: AC

## 2017-11-07 NOTE — Patient Instructions (Addendum)
You likely had a strain of the calf muscle but with recent swelling will order test for blood clot. They will call you with appointment. Use the steroid cream as needed for itching, and cleanse skin with soap and water daily. Lotion after bathing such as aveeno. Stop applying alcohol to area.    Recheck next week. Return to the clinic or go to the nearest emergency room if any of your symptoms worsen or new symptoms occur.   Medial Head Gastrocnemius Tear Medial head gastrocnemius tear, also called tennis leg, is an injury to the inner part of the calf muscle. This injury may include overstretching of the muscle or a partial or complete tear. This is a common sports injury. Calf muscle tears usually occur near the back of the knee. This often causes sudden pain and muscle weakness. What are the causes? This condition is caused by forceful stretching or strain on the calf muscle. This usually happens when you forcefully push off of your foot. It may also happen if you forcefully straighten your knee while your foot is flat on the ground. What increases the risk? The following factors may make you more likely to develop this condition:  Being male and older than age 52.  Playing sports that involve: ? Quick increases in speed and changes of direction, such as tennis and soccer. ? Jumping, such as basketball. ? Running, especially uphill or on uneven ground.  What are the signs or symptoms? Symptoms of this condition include:  Sudden pain in the back of the leg when the injury occurs. You may hear a popping sound or feel like you got hit in your calf.  Pain that is made worse by bringing your toes up toward your shin or by straightening your knee.  Pain on the inside of your calf, from your knee to your ankle.  Not being able to rise up on your toes.  Pain when pressing on your calf muscle.  Swelling of your calf.  Bruising along your calf and lower leg, down to your  ankle.  Difficulty pushing off your foot when walking or using stairs.  How is this diagnosed? This condition may be diagnosed based on:  Your symptoms and medical history.  A physical exam. Your health care provider may be able to feel a lump or a defect in your muscle.  MRI or ultrasound to determine the severity and exact location of your injury.  How is this treated? Treatment for this condition may include:  Resting the muscle and keeping weight off your leg for several days. During this time, you may use crutches or another walking device.  Using a splint to keep your ankle or knee in a stable position.  Wearing a walking boot to decrease the use of your gastrocnemius muscle.  Using a wedge under your heel to reduce stretching of your healing muscle.  Wearing a compression sleeve around your calf muscle.  Icing the muscle.  Raising (elevating) your leg when resting.  Taking medicine for pain and swelling (NSAIDs or steroids).  Doing leg exercises as told by your health care provider or physical therapist.  Follow these instructions at home: If you have a splint or compression sleeve:  Wear it as told by your health care provider. Remove it only as told by your health care provider.  Loosen the splint if your toes tingle, become numb, or turn cold and blue.  If your splint or sleeve is not waterproof: ? Do not let  it get wet. ? Protect it with a watertight covering when you take a bath or a shower.  Keep the splint or sleeve clean. Managing pain, stiffness, and swelling  If directed, put ice on the injured area: ? Put ice in a plastic bag. ? Place a towel between your skin and the bag. ? Leave the ice on for 20 minutes, 2-3 times a day.  Move your toes often to avoid stiffness and to lessen swelling.  Raise (elevate) the injured area above the level of your heart while you are sitting or lying down. Driving  Do not drive or operate heavy machinery while  taking prescription pain medicine.  Ask your health care provider when it is safe to drive. Activity  Do not use the injured limb to support your body weight until your health care provider says that you can. Use crutches as told by your health care provider.  Return gradually to your normal activities as told by your health care provider. Ask your health care provider what activities are safe for you.  Do exercises as told by your health care provider.  Return to sporting activity only as told by your health care provider or physical therapist. Full recovery may take several months. General instructions  Take over-the-counter and prescription medicines only as told by your health care provider.  Keep all follow-up visits as told by your health care provider. This is important. How is this prevented?  Warm up and stretch before being active.  Cool down and stretch after being active.  Give your body time to rest between periods of activity.  Make sure to use equipment that fits you.  Be safe and responsible while being active to avoid falls.  Maintain physical fitness, including: ? Strength. ? Flexibility. Contact a health care provider if:  Your symptoms do not improve with rest and treatment. Get help right away if:  You have swelling or redness in your calf that is getting worse. This information is not intended to replace advice given to you by your health care provider. Make sure you discuss any questions you have with your health care provider. Document Released: 03/29/2005 Document Revised: 12/02/2015 Document Reviewed: 03/11/2015 Elsevier Interactive Patient Education  2018 [employer].    IF you received an x-ray today, you will receive an invoice from [facility]. Please contact [facility] at [phone] with questions or concerns regarding your invoice.   IF you received labwork today, you will receive an invoice from [place]. Please  contact [facility] at [phone] with questions or concerns regarding your invoice.   Our billing staff will not be able to assist you with questions regarding bills from these companies.  You will be contacted with the lab results as soon as they are available. The fastest way to get your results is to activate your My Chart account. Instructions are located on the last page of this paperwork. If you have not heard from [place] regarding the results in 2 weeks, please contact this office.

## 2017-11-07 NOTE — Progress Notes (Signed)
Subjective:    Patient ID: Jim [name], male    DOB: Feb 05, 1961, 57 y.o.   MRN: [id number]  HPI Jim [name] is a 56 y.o. male Presents today for: Chief Complaint  Patient presents with  . right ankle injury    jumped off trailer and shocked right ankle.(injured 2 months ago) Was swollen at the time and now itchy and red. (hot to touch)  . small headache    from heat  Initially presented with R ankle pain/swelling, intial imaging after brief eval in room where localized issue to R ankle. XR of ankle ordered.   On further history, notes R leg injury from 2 months ago and leg swelling. Tried to jump to ground approximately 3-4 feet from trailer. Felt "shock in lower leg/calf" as he was jumping. Landed on anked then may have injured ankle.  Sore in calf, but swelling in lower leg/ankle that night. Initial treatment with ice, motrin for 1 day only. No eval until now. Has continued to work after injury.  Felt like it had improved after 1 week but some itching on inside of ankle and lower leg. Skin felt hard. Started to swell in lower leg again about 2 weeks ago. No reinjury. 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 Swelling improves overnight at time. Itching on lower leg, treats with rubbing alcohol every night. Traveled to [place] 2 weeks ago, drove. Noticed more swelling after returning from driving. No hx of DVT/PE. No chest pain/dyspnea.   No prior ankle or lower leg injury prior to 2 months ago.   Slight headache earlier - not now. Thinks from heat. Now resolved.   Patient Active Problem List   Diagnosis Date Noted  . Asbestos exposure 06/21/2016  . Benign essential HTN 01/30/2016   Past Medical History:  Diagnosis Date  . Hypertension    Past Surgical History:  Procedure Laterality Date  . HERNIA REPAIR Left 1997  . VASECTOMY  2003   No Known Allergies Prior to Admission medications   Medication Sig Start Date End Date Taking? Authorizing Provider    lisinopril (PRINIVIL,ZESTRIL) 5 MG tablet Take 1 tablet (5 mg total) by mouth daily. 12/27/16  Yes McVey, Gelene Mink, PA-C   Social History   Socioeconomic History  . Marital status: Married    Spouse name: Not on file  . Number of children: Not on file  . Years of education: Not on file  . Highest education level: Not on file  Occupational History  . Occupation: Teacher, music  . Financial resource strain: Not on file  . Food insecurity:    Worry: Not on file    Inability: Not on file  . Transportation needs:    Medical: Not on file    Non-medical: Not on file  Tobacco Use  . Smoking status: Never Smoker  . Smokeless tobacco: Never Used  Substance and Sexual Activity  . Alcohol use: No    Alcohol/week: 0.0 oz  . Drug use: No  . Sexual activity: Yes    Birth control/protection: None  Lifestyle  . Physical activity:    Days per week: Not on file    Minutes per session: Not on file  . Stress: Not on file  Relationships  . Social connections:    Talks on phone: Not on file    Gets together: Not on file    Attends religious service: Not on file    Active member of club or organization: Not on  file    Attends meetings of clubs or organizations: Not on file    Relationship status: Not on file  . Intimate partner violence:    Fear of current or ex partner: Not on file    Emotionally abused: Not on file    Physically abused: Not on file    Forced sexual activity: Not on file  Other Topics Concern  . Not on file  Social History Narrative  . Not on file    Review of Systems  Respiratory: Negative for cough and shortness of breath.   Cardiovascular: Positive for leg swelling. Negative for chest pain.  Musculoskeletal: Positive for joint swelling (R lower leg to ankle. ).  Skin: Positive for rash (scratches from itching. ).   As per HPI.     Objective:   Physical Exam  Constitutional: He is oriented to person, place, and time. He appears  well-developed and well-nourished. No distress.  HENT:  Head: Normocephalic and atraumatic.  Cardiovascular: Normal rate.  Pulmonary/Chest: Effort normal.  Musculoskeletal:       Right lower leg: He exhibits tenderness (Proximal gastroc.  Pain in same area with [facility] testing.  No cords palpated . ), swelling and edema (Relatively firm right calf compared to left, 46 cm circumference at 15 cm below patella compared to 44 cm on the left.). He exhibits no bony tenderness.       Legs:      Right foot: There is swelling. There is normal range of motion, no tenderness, no bony tenderness and normal capillary refill.       Feet:  Neurological: He is alert and oriented to person, place, and time.  Skin:     Psychiatric: He has a normal mood and affect.   Neurovascular intact distally.   Vitals:   11/07/17 1705 11/07/17 1707  BP: (!) 148/87 138/76  Pulse: 82   Temp: 98.3 F (36.8 C)   TempSrc: Oral   SpO2: 98%   Weight: 235 lb 6.4 oz (106.8 kg)   Height: 5\' 8"  (1.727 m)       Dg Ankle Complete Right  Result Date: 11/07/2017 CLINICAL DATA:  RIGHT ankle injury 2 months ago, continued pain and swelling EXAM: RIGHT ANKLE - COMPLETE 3+ VIEW COMPARISON:  None FINDINGS: Motion artifact on AP view. Osseous mineralization normal. Joint spaces preserved. No acute fracture, dislocation, or bone destruction. Moderate-sized plantar calcaneal spur. Soft tissue swelling anteriorly extending medially. IMPRESSION: Soft tissue swelling without acute bony abnormalities. Electronically Signed   By: Lavonia Dana M.D.   On: 11/07/2017 17:53       Assessment & Plan:    Jim [name] is a 57 y.o. male Right ankle pain, unspecified chronicity - Plan: DG Ankle Complete Right  Right calf pain - Plan: VAS [place] LOWER EXTREMITY VENOUS (DVT)  Right leg swelling - Plan: VAS [place] LOWER EXTREMITY VENOUS (DVT)  History of recent travel - Plan: VAS [place] LOWER EXTREMITY VENOUS (DVT)  Pruritus - Plan:  triamcinolone cream (KENALOG) 0.1 %  Based on further history he likely had a gastroc strain/tear with initial jumping of trailer, plus or minus ankle sprain with secondary swelling and ankle at night.  Did have some improvement, now with increasing swelling and does note that worsened after recent travel to [place].  Excoriations of lower leg likely dermatitis from edema and use of alcohol/drying.  Does not appear to have cellulitis at this time, but RTC precautions were discussed if any surrounding  erythema or discharge.  -  Ace bandage applied to the calf for possible gastroc tear.  -ultrasound ordered to rule out DVT with recent increased swelling after prolonged car travel  -Triamcinolone topical for pruritus, cleanse with soap and water, RTC precautions if signs or symptoms of cellulitis  -Recheck 1 week  Meds ordered this encounter  Medications  . triamcinolone cream (KENALOG) 0.1 %    Sig: Apply 1 application topically 2 (two) times daily.    Dispense:  30 g    Refill:  0   Patient Instructions   You likely had a strain of the calf muscle but with recent swelling will order test for blood clot. They will call you with appointment. Use the steroid cream as needed for itching, and cleanse skin with soap and water daily. Lotion after bathing such as aveeno. Stop applying alcohol to area.    Recheck next week. Return to the clinic or go to the nearest emergency room if any of your symptoms worsen or new symptoms occur.   Medial Head Gastrocnemius Tear Medial head gastrocnemius tear, also called tennis leg, is an injury to the inner part of the calf muscle. This injury may include overstretching of the muscle or a partial or complete tear. This is a common sports injury. Calf muscle tears usually occur near the back of the knee. This often causes sudden pain and muscle weakness. What are the causes? This condition is caused by forceful stretching or strain on the calf muscle. This usually  happens when you forcefully push off of your foot. It may also happen if you forcefully straighten your knee while your foot is flat on the ground. What increases the risk? The following factors may make you more likely to develop this condition:  Being male and older than age 53.  Playing sports that involve: ? Quick increases in speed and changes of direction, such as tennis and soccer. ? Jumping, such as basketball. ? Running, especially uphill or on uneven ground.  What are the signs or symptoms? Symptoms of this condition include:  Sudden pain in the back of the leg when the injury occurs. You may hear a popping sound or feel like you got hit in your calf.  Pain that is made worse by bringing your toes up toward your shin or by straightening your knee.  Pain on the inside of your calf, from your knee to your ankle.  Not being able to rise up on your toes.  Pain when pressing on your calf muscle.  Swelling of your calf.  Bruising along your calf and lower leg, down to your ankle.  Difficulty pushing off your foot when walking or using stairs.  How is this diagnosed? This condition may be diagnosed based on:  Your symptoms and medical history.  A physical exam. Your health care provider may be able to feel a lump or a defect in your muscle.  MRI or ultrasound to determine the severity and exact location of your injury.  How is this treated? Treatment for this condition may include:  Resting the muscle and keeping weight off your leg for several days. During this time, you may use crutches or another walking device.  Using a splint to keep your ankle or knee in a stable position.  Wearing a walking boot to decrease the use of your gastrocnemius muscle.  Using a wedge under your heel to reduce stretching of your healing muscle.  Wearing a compression sleeve around your calf muscle.  Icing the muscle.  Raising (elevating) your leg when resting.  Taking medicine  for pain and swelling (NSAIDs or steroids).  Doing leg exercises as told by your health care provider or physical therapist.  Follow these instructions at home: If you have a splint or compression sleeve:  Wear it as told by your health care provider. Remove it only as told by your health care provider.  Loosen the splint if your toes tingle, become numb, or turn cold and blue.  If your splint or sleeve is not waterproof: ? Do not let it get wet. ? Protect it with a watertight covering when you take a bath or a shower.  Keep the splint or sleeve clean. Managing pain, stiffness, and swelling  If directed, put ice on the injured area: ? Put ice in a plastic bag. ? Place a towel between your skin and the bag. ? Leave the ice on for 20 minutes, 2-3 times a day.  Move your toes often to avoid stiffness and to lessen swelling.  Raise (elevate) the injured area above the level of your heart while you are sitting or lying down. Driving  Do not drive or operate heavy machinery while taking prescription pain medicine.  Ask your health care provider when it is safe to drive. Activity  Do not use the injured limb to support your body weight until your health care provider says that you can. Use crutches as told by your health care provider.  Return gradually to your normal activities as told by your health care provider. Ask your health care provider what activities are safe for you.  Do exercises as told by your health care provider.  Return to sporting activity only as told by your health care provider or physical therapist. Full recovery may take several months. General instructions  Take over-the-counter and prescription medicines only as told by your health care provider.  Keep all follow-up visits as told by your health care provider. This is important. How is this prevented?  Warm up and stretch before being active.  Cool down and stretch after being active.  Give your  body time to rest between periods of activity.  Make sure to use equipment that fits you.  Be safe and responsible while being active to avoid falls.  Maintain physical fitness, including: ? Strength. ? Flexibility. Contact a health care provider if:  Your symptoms do not improve with rest and treatment. Get help right away if:  You have swelling or redness in your calf that is getting worse. This information is not intended to replace advice given to you by your health care provider. Make sure you discuss any questions you have with your health care provider. Document Released: 03/29/2005 Document Revised: 12/02/2015 Document Reviewed: 03/11/2015 Elsevier Interactive Patient Education  2018 [employer].    IF you received an x-ray today, you will receive an invoice from [facility]. Please contact [facility] at [phone] with questions or concerns regarding your invoice.   IF you received labwork today, you will receive an invoice from [place]. Please contact [facility] at [phone] with questions or concerns regarding your invoice.   Our billing staff will not be able to assist you with questions regarding bills from these companies.  You will be contacted with the lab results as soon as they are available. The fastest way to get your results is to activate your My Chart account. Instructions are located on the last page of this paperwork. If you have not heard from [place] regarding  the results in 2 weeks, please contact this office.       I personally performed the services described in this documentation, which was scribed in my presence. The recorded information has been reviewed and considered for accuracy and completeness, addended by me as needed, and agree with information above.  Signed,   Merri Ray, MD [facility] [place].  11/07/17 6:43 PM

## 2017-11-09 ENCOUNTER — Ambulatory Visit (HOSPITAL_COMMUNITY): Payer: BLUE CROSS/BLUE SHIELD

## 2017-11-14 ENCOUNTER — Other Ambulatory Visit: Payer: Self-pay

## 2017-11-14 ENCOUNTER — Ambulatory Visit (INDEPENDENT_AMBULATORY_CARE_PROVIDER_SITE_OTHER): Payer: BLUE CROSS/BLUE SHIELD | Admitting: Family Medicine

## 2017-11-14 ENCOUNTER — Encounter: Payer: Self-pay | Admitting: Family Medicine

## 2017-11-14 ENCOUNTER — Ambulatory Visit (HOSPITAL_COMMUNITY)
Admission: RE | Admit: 2017-11-14 | Discharge: 2017-11-14 | Disposition: A | Discharge status: Home / Self Care | Payer: BLUE CROSS/BLUE SHIELD | Source: Ambulatory Visit | Attending: Family Medicine | Admitting: Family Medicine

## 2017-11-14 VITALS — BP 139/87 | HR 90 | Temp 98.7°F | Ht 68.0 in | Wt 235.0 lb

## 2017-11-14 DIAGNOSIS — S86111D Strain of other muscle(s) and tendon(s) of posterior muscle group at lower leg level, right leg, subsequent encounter: Secondary | ICD-10-CM

## 2017-11-14 DIAGNOSIS — M7989 Other specified soft tissue disorders: Secondary | ICD-10-CM

## 2017-11-14 DIAGNOSIS — L299 Pruritus, unspecified: Secondary | ICD-10-CM | POA: Diagnosis not present

## 2017-11-14 DIAGNOSIS — Z1211 Encounter for screening for malignant neoplasm of colon: Secondary | ICD-10-CM

## 2017-11-14 DIAGNOSIS — M79661 Pain in right lower leg: Secondary | ICD-10-CM

## 2017-11-14 DIAGNOSIS — Z789 Other specified health status: Secondary | ICD-10-CM | POA: Diagnosis not present

## 2017-11-14 NOTE — Progress Notes (Addendum)
Preliminary results by tech - Venous duplex right lower ext. Completed. Negative for deep and superficial vein thrombosis in the right leg.No evidence of a Baker's Cyst.  Oda Cogan, BS, RDMS, RVT

## 2017-11-14 NOTE — Patient Instructions (Addendum)
Ultrasound did not indicate any blood clot. Your symptoms still appear to be due to a strain of the calf muscle.  Okay to use the Ace bandage as needed throughout the day, see some of the exercises below. Follow-up in the next 3 to 4 weeks for a physical.  If any worsening symptoms before that time, and have been seen.  Medial Head Gastrocnemius Tear Medial head gastrocnemius tear, also called tennis leg, is an injury to the inner part of the calf muscle. This injury may include overstretching of the muscle or a partial or complete tear. This is a common sports injury. Calf muscle tears usually occur near the back of the knee. This often causes sudden pain and muscle weakness. What are the causes? This condition is caused by forceful stretching or strain on the calf muscle. This usually happens when you forcefully push off of your foot. It may also happen if you forcefully straighten your knee while your foot is flat on the ground. What increases the risk? The following factors may make you more likely to develop this condition:  Being male and older than age 4.  Playing sports that involve: ? Quick increases in speed and changes of direction, such as tennis and soccer. ? Jumping, such as basketball. ? Running, especially uphill or on uneven ground.  What are the signs or symptoms? Symptoms of this condition include:  Sudden pain in the back of the leg when the injury occurs. You may hear a popping sound or feel like you got hit in your calf.  Pain that is made worse by bringing your toes up toward your shin or by straightening your knee.  Pain on the inside of your calf, from your knee to your ankle.  Not being able to rise up on your toes.  Pain when pressing on your calf muscle.  Swelling of your calf.  Bruising along your calf and lower leg, down to your ankle.  Difficulty pushing off your foot when walking or using stairs.  How is this diagnosed? This condition may be  diagnosed based on:  Your symptoms and medical history.  A physical exam. Your health care provider may be able to feel a lump or a defect in your muscle.  MRI or ultrasound to determine the severity and exact location of your injury.  How is this treated? Treatment for this condition may include:  Resting the muscle and keeping weight off your leg for several days. During this time, you may use crutches or another walking device.  Using a splint to keep your ankle or knee in a stable position.  Wearing a walking boot to decrease the use of your gastrocnemius muscle.  Using a wedge under your heel to reduce stretching of your healing muscle.  Wearing a compression sleeve around your calf muscle.  Icing the muscle.  Raising (elevating) your leg when resting.  Taking medicine for pain and swelling (NSAIDs or steroids).  Doing leg exercises as told by your health care provider or physical therapist.  Follow these instructions at home: If you have a splint or compression sleeve:  Wear it as told by your health care provider. Remove it only as told by your health care provider.  Loosen the splint if your toes tingle, become numb, or turn cold and blue.  If your splint or sleeve is not waterproof: ? Do not let it get wet. ? Protect it with a watertight covering when you take a bath or a shower.  Keep the splint or sleeve clean. Managing pain, stiffness, and swelling  If directed, put ice on the injured area: ? Put ice in a plastic bag. ? Place a towel between your skin and the bag. ? Leave the ice on for 20 minutes, 2-3 times a day.  Move your toes often to avoid stiffness and to lessen swelling.  Raise (elevate) the injured area above the level of your heart while you are sitting or lying down. Driving  Do not drive or operate heavy machinery while taking prescription pain medicine.  Ask your health care provider when it is safe to drive. Activity  Do not use the  injured limb to support your body weight until your health care provider says that you can. Use crutches as told by your health care provider.  Return gradually to your normal activities as told by your health care provider. Ask your health care provider what activities are safe for you.  Do exercises as told by your health care provider.  Return to sporting activity only as told by your health care provider or physical therapist. Full recovery may take several months. General instructions  Take over-the-counter and prescription medicines only as told by your health care provider.  Keep all follow-up visits as told by your health care provider. This is important. How is this prevented?  Warm up and stretch before being active.  Cool down and stretch after being active.  Give your body time to rest between periods of activity.  Make sure to use equipment that fits you.  Be safe and responsible while being active to avoid falls.  Maintain physical fitness, including: ? Strength. ? Flexibility. Contact a health care provider if:  Your symptoms do not improve with rest and treatment. Get help right away if:  You have swelling or redness in your calf that is getting worse. This information is not intended to replace advice given to you by your health care provider. Make sure you discuss any questions you have with your health care provider. Document Released: 03/29/2005 Document Revised: 12/02/2015 Document Reviewed: 03/11/2015 Elsevier Interactive Patient Education  2018 [employer].    Medial Head Gastrocnemius Tear Rehab Ask your health care provider which exercises are safe for you. Do exercises exactly as told by your health care provider and adjust them as directed. It is normal to feel mild stretching, pulling, tightness, or discomfort as you do these exercises, but you should stop right away if you feel sudden pain or your pain gets worse.Do not begin these exercises  until told by your health care provider. Stretching and range of motion exercises These exercises warm up your muscles and joints and improve the movement and flexibility of your lower leg. These exercises also help to relieve pain and stiffness. Exercise A: Gastrocnemius stretch  1. Sit with your left / right leg extended. 2. Loop a belt or towel around the ball of your left / right foot. The ball of your foot is on the walking surface, right under your toes. 3. Hold both ends of the belt or towel. 4. Keep your left / right ankle and foot relaxed and keep your knee straight while you use the belt or towel to pull your foot and ankle toward you. Stop at the first point of resistance. 5. Hold this position for __________ seconds. Repeat __________ times. Complete this exercise __________ times a day. Exercise B: Ankle alphabet  1. Sit with your left / right leg supported at the lower  leg. ? Do not rest your foot on anything. ? Make sure your foot has room to move freely. 2. Think of your left / right foot as a paintbrush, and move your foot to trace each letter of the alphabet in the air. Keep your hip and knee still while you trace. 3. Trace every letter from A to Z. Repeat __________ times. Complete this exercise __________ times a day. Strengthening exercises These exercises build strength and endurance in your lower leg. Endurance is the ability to use your muscles for a long time, even after they get tired. Exercise C: Plantar flexors with band  1. Sit with your left / right leg extended. 2. Loop a rubber exercise band or tube around the ball of your left / right foot. The ball of your foot is on the walking surface, right under your toes. 3. While holding both ends of the band or tube, slowly point your toes downward, pushing them away from you. 4. Hold this position for __________ seconds. 5. Slowly return your foot to the starting position. Repeat __________ times. Complete this  exercise __________ times a day. Exercise D: Plantar flexors, standing  1. Stand with your feet shoulder-width apart. 2. Place your hands on a wall or table to steady yourself as needed, but try not to use it very much for support. 3. Rise up on your toes. 4. If this exercise is too easy, try these options: ? Shift your weight toward your left / right leg until you feel challenged. ? If told by your health care provider, stand on your left / right foot only. 5. Hold this position for __________ seconds. Repeat __________ times. Complete this exercise __________ times a day. Exercise E: Plantar flexors, eccentric  1. Stand on the balls of your feet on the edge of a step. The ball of your foot is on the walking surface, right under your toes. 2. Place your hands on a wall or railing for balance as needed, but try not to lean on it for support. 3. Rise up on your toes, using both legs to help. 4. Slowly shift all of your weight to your left / right foot and lift your other foot off the step. 5. Slowly lower your left / right heel so it drops below the level of the step. You will feel a slight stretch in your left / right calf. 6. Put your other foot back onto the step. Repeat __________ times. Complete this exercise __________ times a day. This information is not intended to replace advice given to you by your health care provider. Make sure you discuss any questions you have with your health care provider. Document Released: 03/29/2005 Document Revised: 12/04/2015 Document Reviewed: 03/11/2015 Elsevier Interactive Patient Education  2018 [employer].   IF you received an x-ray today, you will receive an invoice from [facility]. Please contact [facility] at [phone] with questions or concerns regarding your invoice.   IF you received labwork today, you will receive an invoice from [facility]. Please contact [facility] at [phone] with questions or concerns regarding  your invoice.   Our billing staff will not be able to assist you with questions regarding bills from these companies.  You will be contacted with the lab results as soon as they are available. The fastest way to get your results is to activate your My Chart account. Instructions are located on the last page of this paperwork. If you have not heard from [place] regarding the results  in 2 weeks, please contact this office.

## 2017-11-14 NOTE — Progress Notes (Signed)
Subjective:    Patient ID: Jim [name], male    DOB: 06-Jul-1960, 57 y.o.   MRN: [id number]  HPI Jim [name] is a 57 y.o. male Presents today for: Chief Complaint  Patient presents with  . Follow-up    righr ankle pain, ct scan was done today at 3pm  . colonoscopy referral request   Here for follow-up of right lower leg injury.  See office visit July 29.  Injury few months ago after jumping to ground from 3 to 4 feet in the air.  Felt a shock sensation in his lower leg/calf as he was jumping ultimately had discomfort in his calf.  Did have some return of swelling after symptoms initially had improved, and further history obtained that he had traveled to [place] 2 weeks prior.  Suspected tennis leg/gastroc tear but with increased swelling ultrasound was ordered to rule out DVT.  Has some overlying skin irritation swelling, treated with triamcinolone topical.  Symptomatic care and RTC precautions were given for signs or symptoms of cellulitis.  Ultrasound of the right lower extremity completed today negative for deep or superficial venous thrombosis no Baker's cyst  Still able to walk on leg, able to work, driving usually. some soreness at times and swelling - not limiting his work. No pain meds taken.    Still some itching - tried otc cortisone - did not pick up Rx steroid cream.      Patient Active Problem List   Diagnosis Date Noted  . Asbestos exposure 06/21/2016  . Benign essential HTN 01/30/2016   Past Medical History:  Diagnosis Date  . Hypertension    Past Surgical History:  Procedure Laterality Date  . HERNIA REPAIR Left 1997  . VASECTOMY  2003   No Known Allergies Prior to Admission medications   Medication Sig Start Date End Date Taking? Authorizing Provider  lisinopril (PRINIVIL,ZESTRIL) 5 MG tablet Take 1 tablet (5 mg total) by mouth daily. 12/27/16  Yes McVey, Gelene Mink, PA-C  triamcinolone cream (KENALOG) 0.1 % Apply 1 application  topically 2 (two) times daily. 11/07/17  Yes [name], MD   Social History   Socioeconomic History  . Marital status: Married    Spouse name: Not on file  . Number of children: Not on file  . Years of education: Not on file  . Highest education level: Not on file  Occupational History  . Occupation: Teacher, music  . Financial resource strain: Not on file  . Food insecurity:    Worry: Not on file    Inability: Not on file  . Transportation needs:    Medical: Not on file    Non-medical: Not on file  Tobacco Use  . Smoking status: Never Smoker  . Smokeless tobacco: Never Used  Substance and Sexual Activity  . Alcohol use: No    Alcohol/week: 0.0 oz  . Drug use: No  . Sexual activity: Yes    Birth control/protection: None  Lifestyle  . Physical activity:    Days per week: Not on file    Minutes per session: Not on file  . Stress: Not on file  Relationships  . Social connections:    Talks on phone: Not on file    Gets together: Not on file    Attends religious service: Not on file    Active member of club or organization: Not on file    Attends meetings of clubs or organizations: Not on file    Relationship status:  Not on file  . Intimate partner violence:    Fear of current or ex partner: Not on file    Emotionally abused: Not on file    Physically abused: Not on file    Forced sexual activity: Not on file  Other Topics Concern  . Not on file  Social History Narrative  . Not on file    Review of Systems     Objective:   Physical Exam  Constitutional: He is oriented to person, place, and time. He appears well-developed and well-nourished. No distress.  HENT:  Head: Normocephalic and atraumatic.  Cardiovascular: Normal rate.  Pulmonary/Chest: Effort normal.  Musculoskeletal:       Right lower leg: He exhibits tenderness (Minimal tenderness proximal gastroc medial head, no significant defect palpated.  NVI distally.  Few excoriated areas  lower leg.  Achilles nontender, negative Thompson).  Neurological: He is alert and oriented to person, place, and time.  Psychiatric: He has a normal mood and affect.   Vitals:   11/14/17 1716  BP: 139/87  Pulse: 90  Temp: 98.7 F (37.1 C)  TempSrc: Oral  SpO2: 98%  Weight: 235 lb (106.6 kg)  Height: 5\' 8"  (1.727 m)       Assessment & Plan:   Osman Calzadilla is a 57 y.o. male Gastrocnemius strain, right, subsequent encounter  Screen for colon cancer - Plan: Ambulatory referral to Gastroenterology  Pruritus  Right leg swelling  Gastroc strain, DVT ruled out by ultrasound today.  Overall has good strength with plantarflexion, no distal/Achilles symptoms.  Functionally doing well at work.  Ace bandage applied, use discussed, handout given on gastroc tear as well as home rehab.  Recheck in 3 to 4 weeks then option of Ortho eval if not improving.  Requested colonoscopy referral, referral placed, plan for physical in the next 1 month  No orders of the defined types were placed in this encounter.  Patient Instructions   Ultrasound did not indicate any blood clot. Your symptoms still appear to be due to a strain of the calf muscle.  Okay to use the Ace bandage as needed throughout the day, see some of the exercises below. Follow-up in the next 3 to 4 weeks for a physical.  If any worsening symptoms before that time, and have been seen.  Medial Head Gastrocnemius Tear Medial head gastrocnemius tear, also called tennis leg, is an injury to the inner part of the calf muscle. This injury may include overstretching of the muscle or a partial or complete tear. This is a common sports injury. Calf muscle tears usually occur near the back of the knee. This often causes sudden pain and muscle weakness. What are the causes? This condition is caused by forceful stretching or strain on the calf muscle. This usually happens when you forcefully push off of your foot. It may also happen if you  forcefully straighten your knee while your foot is flat on the ground. What increases the risk? The following factors may make you more likely to develop this condition:  Being male and older than age 72.  Playing sports that involve: ? Quick increases in speed and changes of direction, such as tennis and soccer. ? Jumping, such as basketball. ? Running, especially uphill or on uneven ground.  What are the signs or symptoms? Symptoms of this condition include:  Sudden pain in the back of the leg when the injury occurs. You may hear a popping sound or feel like you got hit in  your calf.  Pain that is made worse by bringing your toes up toward your shin or by straightening your knee.  Pain on the inside of your calf, from your knee to your ankle.  Not being able to rise up on your toes.  Pain when pressing on your calf muscle.  Swelling of your calf.  Bruising along your calf and lower leg, down to your ankle.  Difficulty pushing off your foot when walking or using stairs.  How is this diagnosed? This condition may be diagnosed based on:  Your symptoms and medical history.  A physical exam. Your health care provider may be able to feel a lump or a defect in your muscle.  MRI or ultrasound to determine the severity and exact location of your injury.  How is this treated? Treatment for this condition may include:  Resting the muscle and keeping weight off your leg for several days. During this time, you may use crutches or another walking device.  Using a splint to keep your ankle or knee in a stable position.  Wearing a walking boot to decrease the use of your gastrocnemius muscle.  Using a wedge under your heel to reduce stretching of your healing muscle.  Wearing a compression sleeve around your calf muscle.  Icing the muscle.  Raising (elevating) your leg when resting.  Taking medicine for pain and swelling (NSAIDs or steroids).  Doing leg exercises as told  by your health care provider or physical therapist.  Follow these instructions at home: If you have a splint or compression sleeve:  Wear it as told by your health care provider. Remove it only as told by your health care provider.  Loosen the splint if your toes tingle, become numb, or turn cold and blue.  If your splint or sleeve is not waterproof: ? Do not let it get wet. ? Protect it with a watertight covering when you take a bath or a shower.  Keep the splint or sleeve clean. Managing pain, stiffness, and swelling  If directed, put ice on the injured area: ? Put ice in a plastic bag. ? Place a towel between your skin and the bag. ? Leave the ice on for 20 minutes, 2-3 times a day.  Move your toes often to avoid stiffness and to lessen swelling.  Raise (elevate) the injured area above the level of your heart while you are sitting or lying down. Driving  Do not drive or operate heavy machinery while taking prescription pain medicine.  Ask your health care provider when it is safe to drive. Activity  Do not use the injured limb to support your body weight until your health care provider says that you can. Use crutches as told by your health care provider.  Return gradually to your normal activities as told by your health care provider. Ask your health care provider what activities are safe for you.  Do exercises as told by your health care provider.  Return to sporting activity only as told by your health care provider or physical therapist. Full recovery may take several months. General instructions  Take over-the-counter and prescription medicines only as told by your health care provider.  Keep all follow-up visits as told by your health care provider. This is important. How is this prevented?  Warm up and stretch before being active.  Cool down and stretch after being active.  Give your body time to rest between periods of activity.  Make sure to use equipment  that  fits you.  Be safe and responsible while being active to avoid falls.  Maintain physical fitness, including: ? Strength. ? Flexibility. Contact a health care provider if:  Your symptoms do not improve with rest and treatment. Get help right away if:  You have swelling or redness in your calf that is getting worse. This information is not intended to replace advice given to you by your health care provider. Make sure you discuss any questions you have with your health care provider. Document Released: 03/29/2005 Document Revised: 12/02/2015 Document Reviewed: 03/11/2015 Elsevier Interactive Patient Education  2018 [employer].    Medial Head Gastrocnemius Tear Rehab Ask your health care provider which exercises are safe for you. Do exercises exactly as told by your health care provider and adjust them as directed. It is normal to feel mild stretching, pulling, tightness, or discomfort as you do these exercises, but you should stop right away if you feel sudden pain or your pain gets worse.Do not begin these exercises until told by your health care provider. Stretching and range of motion exercises These exercises warm up your muscles and joints and improve the movement and flexibility of your lower leg. These exercises also help to relieve pain and stiffness. Exercise A: Gastrocnemius stretch  1. Sit with your left / right leg extended. 2. Loop a belt or towel around the ball of your left / right foot. The ball of your foot is on the walking surface, right under your toes. 3. Hold both ends of the belt or towel. 4. Keep your left / right ankle and foot relaxed and keep your knee straight while you use the belt or towel to pull your foot and ankle toward you. Stop at the first point of resistance. 5. Hold this position for __________ seconds. Repeat __________ times. Complete this exercise __________ times a day. Exercise B: Ankle alphabet  1. Sit with your left / right leg  supported at the lower leg. ? Do not rest your foot on anything. ? Make sure your foot has room to move freely. 2. Think of your left / right foot as a paintbrush, and move your foot to trace each letter of the alphabet in the air. Keep your hip and knee still while you trace. 3. Trace every letter from A to Z. Repeat __________ times. Complete this exercise __________ times a day. Strengthening exercises These exercises build strength and endurance in your lower leg. Endurance is the ability to use your muscles for a long time, even after they get tired. Exercise C: Plantar flexors with band  1. Sit with your left / right leg extended. 2. Loop a rubber exercise band or tube around the ball of your left / right foot. The ball of your foot is on the walking surface, right under your toes. 3. While holding both ends of the band or tube, slowly point your toes downward, pushing them away from you. 4. Hold this position for __________ seconds. 5. Slowly return your foot to the starting position. Repeat __________ times. Complete this exercise __________ times a day. Exercise D: Plantar flexors, standing  1. Stand with your feet shoulder-width apart. 2. Place your hands on a wall or table to steady yourself as needed, but try not to use it very much for support. 3. Rise up on your toes. 4. If this exercise is too easy, try these options: ? Shift your weight toward your left / right leg until you feel challenged. ? If told by  your health care provider, stand on your left / right foot only. 5. Hold this position for __________ seconds. Repeat __________ times. Complete this exercise __________ times a day. Exercise E: Plantar flexors, eccentric  1. Stand on the balls of your feet on the edge of a step. The ball of your foot is on the walking surface, right under your toes. 2. Place your hands on a wall or railing for balance as needed, but try not to lean on it for support. 3. Rise up on your  toes, using both legs to help. 4. Slowly shift all of your weight to your left / right foot and lift your other foot off the step. 5. Slowly lower your left / right heel so it drops below the level of the step. You will feel a slight stretch in your left / right calf. 6. Put your other foot back onto the step. Repeat __________ times. Complete this exercise __________ times a day. This information is not intended to replace advice given to you by your health care provider. Make sure you discuss any questions you have with your health care provider. Document Released: 03/29/2005 Document Revised: 12/04/2015 Document Reviewed: 03/11/2015 Elsevier Interactive Patient Education  2018 [employer].   IF you received an x-ray today, you will receive an invoice from [facility]. Please contact [facility] at [phone] with questions or concerns regarding your invoice.   IF you received labwork today, you will receive an invoice from [facility]. Please contact [facility] at [phone] with questions or concerns regarding your invoice.   Our billing staff will not be able to assist you with questions regarding bills from these companies.  You will be contacted with the lab results as soon as they are available. The fastest way to get your results is to activate your My Chart account. Instructions are located on the last page of this paperwork. If you have not heard from [place] regarding the results in 2 weeks, please contact this office.      Signed,   Merri Ray, MD [facility] [place].  11/14/17 6:37 PM

## 2017-12-26 ENCOUNTER — Other Ambulatory Visit: Payer: Self-pay | Admitting: Physician Assistant

## 2017-12-26 DIAGNOSIS — I10 Essential (primary) hypertension: Secondary | ICD-10-CM

## 2017-12-27 ENCOUNTER — Other Ambulatory Visit: Payer: Self-pay | Admitting: *Deleted

## 2017-12-27 NOTE — Telephone Encounter (Signed)
Rx has been pended at office for review

## 2018-01-03 ENCOUNTER — Encounter: Payer: Self-pay | Admitting: Gastroenterology

## 2018-01-20 ENCOUNTER — Other Ambulatory Visit: Payer: Self-pay

## 2018-01-20 ENCOUNTER — Ambulatory Visit (AMBULATORY_SURGERY_CENTER): Payer: Self-pay | Admitting: *Deleted

## 2018-01-20 ENCOUNTER — Encounter: Payer: Self-pay | Admitting: Gastroenterology

## 2018-01-20 VITALS — Ht 67.0 in | Wt 236.2 lb

## 2018-01-20 DIAGNOSIS — Z1211 Encounter for screening for malignant neoplasm of colon: Secondary | ICD-10-CM

## 2018-01-20 MED ORDER — SUPREP BOWEL PREP KIT 17.5-3.13-1.6 GM/177ML PO SOLN: 1.0000 | Freq: Once | ORAL | 0 refills | Status: AC

## 2018-01-20 NOTE — Progress Notes (Signed)
Patient denies any allergies to egg or soy products. Patient denies complications with anesthesia/sedation.  Patient denies oxygen use at home and denies diet medications. Pamphlet given on colonoscopy. 

## 2018-02-01 ENCOUNTER — Ambulatory Visit (AMBULATORY_SURGERY_CENTER): Payer: BLUE CROSS/BLUE SHIELD | Admitting: Gastroenterology

## 2018-02-01 ENCOUNTER — Encounter: Payer: Self-pay | Admitting: Gastroenterology

## 2018-02-01 VITALS — BP 107/56 | HR 71 | Temp 98.6°F | Resp 15 | Ht 67.0 in | Wt 236.0 lb

## 2018-02-01 DIAGNOSIS — D125 Benign neoplasm of sigmoid colon: Secondary | ICD-10-CM

## 2018-02-01 DIAGNOSIS — K635 Polyp of colon: Secondary | ICD-10-CM

## 2018-02-01 DIAGNOSIS — D120 Benign neoplasm of cecum: Secondary | ICD-10-CM

## 2018-02-01 DIAGNOSIS — Z1211 Encounter for screening for malignant neoplasm of colon: Secondary | ICD-10-CM | POA: Diagnosis present

## 2018-02-01 MED ORDER — SODIUM CHLORIDE 0.9 % IV SOLN: 500.0000 mL | Freq: Once | INTRAVENOUS | Status: AC

## 2018-02-01 NOTE — Patient Instructions (Signed)
Continue present medications. Please read handouts on polyps and hemorrhoids. Await pathology results.     YOU HAD AN ENDOSCOPIC PROCEDURE TODAY AT [facility]:   Refer to the procedure report that was given to you for any specific questions about what was found during the examination.  If the procedure report does not answer your questions, please call your gastroenterologist to clarify.  If you requested that your care partner not be given the details of your procedure findings, then the procedure report has been included in a sealed envelope for you to review at your convenience later.  YOU SHOULD EXPECT: Some feelings of bloating in the abdomen. Passage of more gas than usual.  Walking can help get rid of the air that was put into your GI tract during the procedure and reduce the bloating. If you had a lower endoscopy (such as a colonoscopy or flexible sigmoidoscopy) you may notice spotting of blood in your stool or on the toilet paper. If you underwent a bowel prep for your procedure, you may not have a normal bowel movement for a few days.  Please Note:  You might notice some irritation and congestion in your nose or some drainage.  This is from the oxygen used during your procedure.  There is no need for concern and it should clear up in a day or so.  SYMPTOMS TO REPORT IMMEDIATELY:   Following lower endoscopy (colonoscopy or flexible sigmoidoscopy):  Excessive amounts of blood in the stool  Significant tenderness or worsening of abdominal pains  Swelling of the abdomen that is new, acute  Fever of 100F or higher    For urgent or emergent issues, a gastroenterologist can be reached at any hour by calling [phone].   DIET:  We do recommend a small meal at first, but then you may proceed to your regular diet.  Drink plenty of fluids but you should avoid alcoholic beverages for 24 hours.  ACTIVITY:  You should plan to take it easy for the rest of today and you  should NOT DRIVE or use heavy machinery until tomorrow (because of the sedation medicines used during the test).    FOLLOW UP: Our staff will call the number listed on your records the next business day following your procedure to check on you and address any questions or concerns that you may have regarding the information given to you following your procedure. If we do not reach you, we will leave a message.  However, if you are feeling well and you are not experiencing any problems, there is no need to return our call.  We will assume that you have returned to your regular daily activities without incident.  If any biopsies were taken you will be contacted by phone or by letter within the next 1-3 weeks.  Please call us at [phone] if you have not heard about the biopsies in 3 weeks.    SIGNATURES/CONFIDENTIALITY: You and/or your care partner have signed paperwork which will be entered into your electronic medical record.  These signatures attest to the fact that that the information above on your After Visit Summary has been reviewed and is understood.  Full responsibility of the confidentiality of this discharge information lies with you and/or your care-partner.

## 2018-02-01 NOTE — Progress Notes (Signed)
A/ox3 pleased with MAC, report to RN 

## 2018-02-01 NOTE — Progress Notes (Signed)
Called to room to assist during endoscopic procedure.  Patient ID and intended procedure confirmed with present staff. Received instructions for my participation in the procedure from the performing physician.  

## 2018-02-01 NOTE — Op Note (Signed)
[name]: Jim [name] Procedure Date: 02/01/2018 2:09 PM MRN: [id number] Endoscopist: Mauri Pole , MD Age: 57 Referring MD:  Date of Birth: Jul 05, 1960 Gender: Male Account #: [id number] Procedure:                Colonoscopy Indications:              Screening for colorectal malignant neoplasm Medicines:                Monitored Anesthesia Care Procedure:                Pre-Anesthesia Assessment:                           - Prior to the procedure, a History and Physical                            was performed, and patient medications and                            allergies were reviewed. The patient's tolerance of                            previous anesthesia was also reviewed. The risks                            and benefits of the procedure and the sedation                            options and risks were discussed with the patient.                            All questions were answered, and informed consent                            was obtained. Prior Anticoagulants: The patient has                            taken no previous anticoagulant or antiplatelet                            agents. ASA Grade Assessment: II - A patient with                            mild systemic disease. After reviewing the risks                            and benefits, the patient was deemed in                            satisfactory condition to undergo the procedure.                           After obtaining informed consent, the colonoscope  was passed under direct vision. Throughout the                            procedure, the patient's blood pressure, pulse, and                            oxygen saturations were monitored continuously. The                            Colonoscope was introduced through the anus and                            advanced to the the cecum, identified by                            appendiceal  orifice and ileocecal valve. The                            colonoscopy was performed without difficulty. The                            patient tolerated the procedure well. The quality                            of the bowel preparation was good. The ileocecal                            valve, appendiceal orifice, and rectum were                            photographed. Scope In: 2:19:58 PM Scope Out: 2:38:19 PM Scope Withdrawal Time: 0 hours 15 minutes 41 seconds  Total Procedure Duration: 0 hours 18 minutes 21 seconds  Findings:                 The perianal and digital rectal examinations were                            normal.                           A 2 mm polyp was found in the cecum. The polyp was                            sessile. The polyp was removed with a cold biopsy                            forceps. Resection and retrieval were complete.                           Two sessile polyps were found in the sigmoid colon.                            The polyps were 5 to 7 mm in size. These polyps  were removed with a cold snare. Resection and                            retrieval were complete.                           Non-bleeding internal hemorrhoids were found during                            retroflexion. The hemorrhoids were small. Complications:            No immediate complications. Estimated Blood Loss:     Estimated blood loss was minimal. Impression:               - One 2 mm polyp in the cecum, removed with a cold                            biopsy forceps. Resected and retrieved.                           - Two 5 to 7 mm polyps in the sigmoid colon,                            removed with a cold snare. Resected and retrieved.                           - Non-bleeding internal hemorrhoids. Recommendation:           - Patient has a contact number available for                            emergencies. The signs and symptoms of potential                             delayed complications were discussed with the                            patient. Return to normal activities tomorrow.                            Written discharge instructions were provided to the                            patient.                           - Resume previous diet.                           - Continue present medications.                           - Await pathology results.                           - Repeat colonoscopy in 3 - 5 years for  surveillance based on pathology results. Mauri Pole, MD 02/01/2018 2:43:54 PM This report has been signed electronically.

## 2018-02-02 ENCOUNTER — Telehealth: Payer: Self-pay | Admitting: *Deleted

## 2018-02-02 NOTE — Telephone Encounter (Signed)
  Follow up Call-  Call back number 02/01/2018  Post procedure Call Back phone  # [phone]      [phone]  Permission to leave phone message Yes  Some recent data might be hidden     Patient questions:  Do you have a fever, pain , or abdominal swelling? No. Pain Score  0 *  Have you tolerated food without any problems? Yes.    Have you been able to return to your normal activities? Yes.    Do you have any questions about your discharge instructions: Diet   No. Medications  No. Follow up visit  No.  Do you have questions or concerns about your Care? No.  Actions: * If pain score is 4 or above: No action needed, pain <4.

## 2018-02-08 ENCOUNTER — Other Ambulatory Visit: Payer: Self-pay | Admitting: Physician Assistant

## 2018-02-08 DIAGNOSIS — I10 Essential (primary) hypertension: Secondary | ICD-10-CM

## 2018-02-08 NOTE — Telephone Encounter (Signed)
Requested medication (s) are due for refill today: yes  Requested medication (s) are on the active medication list: yes  Last refill:  01/07/18 #30  Future visit scheduled: No  Notes to clinic:  Unable to fill per protocol due to creatinine and potassium not being checked since 2017    Requested Prescriptions  Pending Prescriptions Disp Refills   lisinopril (PRINIVIL,ZESTRIL) 5 MG tablet [Pharmacy Med Name: LISINOPRIL 5MG       TAB] 30 tablet 0    Sig: TAKE 1 TABLET BY MOUTH ONCE DAILY     Cardiovascular:  ACE Inhibitors Failed - 02/08/2018  7:00 PM      Failed - Cr in normal range and within 180 days    Creat  Date Value Ref Range Status  05/03/2015 0.98 0.70 - 1.33 mg/dL Final         Failed - K in normal range and within 180 days    Potassium  Date Value Ref Range Status  05/03/2015 4.9 3.5 - 5.3 mmol/L Final         Passed - Patient is not pregnant      Passed - Last BP in normal range    BP Readings from Last 1 Encounters:  02/01/18 (!) 107/56         Passed - Valid encounter within last 6 months    Recent Outpatient Visits          2 months ago Gastrocnemius strain, right, subsequent encounter   [facility], Ranell Patrick, MD   3 months ago Right ankle pain, unspecified chronicity   [facility], Ranell Patrick, MD   1 year ago Essential hypertension   [facility] at [facility], Gelene Mink, PA-C   2 years ago Pain of left hip joint   Primary Care at [facility], [place], [place]   2 years ago Annual physical exam   [facility], Loura Back, MD

## 2018-02-16 ENCOUNTER — Encounter: Payer: Self-pay | Admitting: Gastroenterology

## 2018-03-10 ENCOUNTER — Other Ambulatory Visit: Payer: Self-pay | Admitting: Family Medicine

## 2018-03-10 DIAGNOSIS — I10 Essential (primary) hypertension: Secondary | ICD-10-CM

## 2018-03-13 ENCOUNTER — Other Ambulatory Visit: Payer: Self-pay | Admitting: Family Medicine

## 2018-03-13 DIAGNOSIS — I10 Essential (primary) hypertension: Secondary | ICD-10-CM

## 2018-03-13 NOTE — Telephone Encounter (Signed)
Copied from [place] [id number] [phone]. Topic: Quick Communication - Rx Refill/Question >> Mar 13, 2018  6:56 PM Windy Kalata wrote: Medication: lisinopril (PRINIVIL,ZESTRIL) 5 MG tablet   Has the patient contacted their pharmacy? Yes.   (Agent: If no, request that the patient contact the pharmacy for the refill.) (Agent: If yes, when and what did the pharmacy advise?)  Preferred Pharmacy (with phone number or street name): [facility] ([address] [place] [phone] (Phone) [phone] (Fax)    Agent: Please be advised that RX refills may take up to 3 business days. We ask that you follow-up with your pharmacy.

## 2018-03-14 MED ORDER — LISINOPRIL 5 MG PO TABS: 5.0000 mg | ORAL_TABLET | Freq: Every day | ORAL | 0 refills | Status: DC

## 2018-04-04 ENCOUNTER — Other Ambulatory Visit: Payer: Self-pay | Admitting: Family Medicine

## 2018-04-04 DIAGNOSIS — I10 Essential (primary) hypertension: Secondary | ICD-10-CM

## 2018-04-04 NOTE — Telephone Encounter (Signed)
Requested medication (s) are due for refill today - yes  Requested medication (s) are on the active medication list -yes  Future visit scheduled -no  Last refill: 03/14/18  Notes to clinic: Left message to call to schedule physical- patient has had courtesy refill. Sent for provider review.  Requested Prescriptions  Pending Prescriptions Disp Refills   lisinopril (PRINIVIL,ZESTRIL) 5 MG tablet [Pharmacy Med Name: Lisinopril 5 MG Oral Tablet] 30 tablet 0    Sig: TAKE 1 TABLET BY MOUTH ONCE DAILY     Cardiovascular:  ACE Inhibitors Failed - 04/04/2018  1:19 PM      Failed - Cr in normal range and within 180 days    Creat  Date Value Ref Range Status  05/03/2015 0.98 0.70 - 1.33 mg/dL Final         Failed - K in normal range and within 180 days    Potassium  Date Value Ref Range Status  05/03/2015 4.9 3.5 - 5.3 mmol/L Final         Passed - Patient is not pregnant      Passed - Last BP in normal range    BP Readings from Last 1 Encounters:  02/01/18 (!) 107/56         Passed - Valid encounter within last 6 months    Recent Outpatient Visits          4 months ago Gastrocnemius strain, right, subsequent encounter   [facility], Ranell Patrick, MD   4 months ago Right ankle pain, unspecified chronicity   [facility], Ranell Patrick, MD   1 year ago Essential hypertension   [facility] at [facility], Gelene Mink, PA-C   2 years ago Pain of left hip joint   Primary Care at [facility], [place], [place]   2 years ago Annual physical exam   [facility], Loura Back, MD              Requested Prescriptions  Pending Prescriptions Disp Refills   lisinopril (PRINIVIL,ZESTRIL) 5 MG tablet [Pharmacy Med Name: Lisinopril 5 MG Oral Tablet] 30 tablet 0    Sig: TAKE 1 TABLET BY MOUTH ONCE DAILY     Cardiovascular:  ACE Inhibitors Failed - 04/04/2018  1:19 PM      Failed - Cr in normal range and within 180 days    Creat  Date  Value Ref Range Status  05/03/2015 0.98 0.70 - 1.33 mg/dL Final         Failed - K in normal range and within 180 days    Potassium  Date Value Ref Range Status  05/03/2015 4.9 3.5 - 5.3 mmol/L Final         Passed - Patient is not pregnant      Passed - Last BP in normal range    BP Readings from Last 1 Encounters:  02/01/18 (!) 107/56         Passed - Valid encounter within last 6 months    Recent Outpatient Visits          4 months ago Gastrocnemius strain, right, subsequent encounter   [facility], Ranell Patrick, MD   4 months ago Right ankle pain, unspecified chronicity   [facility], Ranell Patrick, MD   1 year ago Essential hypertension   Primary Care at [facility], Gelene Mink, PA-C   2 years ago Pain of left hip joint  Primary Care at [facility], [place], [place]   2 years ago Annual physical exam   [facility], Loura Back, MD

## 2018-04-06 ENCOUNTER — Ambulatory Visit: Payer: BLUE CROSS/BLUE SHIELD | Admitting: Physician Assistant

## 2018-04-06 ENCOUNTER — Encounter: Payer: Self-pay | Admitting: Physician Assistant

## 2018-04-06 VITALS — BP 157/90 | HR 100 | Temp 98.0°F | Resp 16 | Ht 68.0 in | Wt 233.0 lb

## 2018-04-06 DIAGNOSIS — J069 Acute upper respiratory infection, unspecified: Secondary | ICD-10-CM | POA: Diagnosis not present

## 2018-04-06 DIAGNOSIS — R05 Cough: Secondary | ICD-10-CM | POA: Diagnosis not present

## 2018-04-06 DIAGNOSIS — R059 Cough, unspecified: Secondary | ICD-10-CM

## 2018-04-06 MED ORDER — HYDROCOD POLST-CPM POLST ER 10-8 MG/5ML PO SUER: 5.0000 mL | Freq: Two times a day (BID) | ORAL | 0 refills | Status: AC | PRN

## 2018-04-06 MED ORDER — BENZONATATE 100 MG PO CAPS: 100.0000 mg | ORAL_CAPSULE | Freq: Three times a day (TID) | ORAL | 0 refills | Status: AC | PRN

## 2018-04-06 NOTE — Progress Notes (Signed)
Jim [name]  MRN: [id number] DOB: 01/24/1961  PCP: [name], MD  Subjective:  Pt is a 57 year old male who presents to clinic for cough.  He has been taking NyQuil and DayQuil - not helping.   Review of Systems  Constitutional: Positive for fatigue. Negative for chills, diaphoresis and fever.  HENT: Positive for congestion, rhinorrhea and sore throat. Negative for postnasal drip, sinus pressure, sinus pain and sneezing.   Respiratory: Positive for cough. Negative for shortness of breath and wheezing.   Cardiovascular: Negative for chest pain and palpitations.    Patient Active Problem List   Diagnosis Date Noted  . Asbestos exposure 06/21/2016  . Benign essential HTN 01/30/2016    Current Outpatient Medications on File Prior to Visit  Medication Sig Dispense Refill  . lisinopril (PRINIVIL,ZESTRIL) 5 MG tablet Take 1 tablet (5 mg total) by mouth daily. Call for appt with new PCP - no further refills 30 tablet 0  . triamcinolone cream (KENALOG) 0.1 % Apply 1 application topically 2 (two) times daily. 30 g 0   Current Facility-Administered Medications on File Prior to Visit  Medication Dose Route Frequency Provider Last Rate Last Dose  . 0.9 %  sodium chloride infusion  500 mL Intravenous Once Nandigam, Kavitha V, MD        No Known Allergies   Objective:  BP (!) 157/90 (BP Location: Right Arm, Patient Position: Sitting, Cuff Size: Large)   Pulse 100   Temp 98 F (36.7 C) (Oral)   Resp 16   Ht 5\' 8"  (1.727 m)   Wt 233 lb (105.7 kg)   SpO2 98%   BMI 35.43 kg/m   Physical Exam Vitals signs reviewed.  Constitutional:      General: He is not in acute distress. HENT:     Right Ear: Tympanic membrane normal.     Left Ear: Tympanic membrane normal.     Nose: Mucosal edema present. No rhinorrhea.     Right Sinus: No maxillary sinus tenderness or frontal sinus tenderness.     Left Sinus: No maxillary sinus tenderness or frontal sinus tenderness.    Cardiovascular:     Rate and Rhythm: Normal rate and regular rhythm.     Heart sounds: Normal heart sounds.  Pulmonary:     Effort: Pulmonary effort is normal. No respiratory distress.     Breath sounds: Normal breath sounds. No wheezing or rales.  Skin:    General: Skin is warm and dry.  Neurological:     Mental Status: He is alert and oriented to person, place, and time.  Psychiatric:        Judgment: Judgment normal.     Assessment and Plan :  1. Viral upper respiratory illness 2. Cough - Pt c/o cough and congestion x 2 days. Vitals are stable. NAD. PE unremarkable. Suspect viral illness. Advised supportive care. RTC in 5 days if no improvement.  - benzonatate (TESSALON) 100 MG capsule; Take 1-2 capsules (100-200 mg total) by mouth 3 (three) times daily as needed for cough.  Dispense: 40 capsule; Refill: 0 - chlorpheniramine-HYDROcodone (TUSSIONEX PENNKINETIC ER) 10-8 MG/5ML SUER; Take 5 mLs by mouth every 12 (twelve) hours as needed for cough.  Dispense: 100 mL; Refill: 0    [name], PA-C  [facility] 04/06/2018 2:13 PM  Please note: Portions of this report may have been transcribed using dragon voice recognition software. Every effort was made to ensure accuracy; however,  inadvertent computerized transcription errors may be present.

## 2018-04-06 NOTE — Patient Instructions (Addendum)
Tessalon is for cough during the day. This should not make you drowsy.  Tussionex is to help your cough at night. This will make you drowsy. Do not take this with any other medications that make your drowsy.  Mucinex will help thin out your mucus to help you clear it out. Drink plenty of water while taking this medication.  Cepacol throat lozenges. (buy this at your pharmacy)  Stay well hydrated. Get lost of rest. Wash your hands often. Come back in 5-7 days if you are not improving.   -Foods that can help speed recovery: honey, garlic, chicken soup, elderberries, green tea.  -Supplements that can help speed recovery: vitamin C, zinc, elderberry extract, quercetin, ginseng, selenium -Supplement with prebiotics and probiotics:   Advil or ibuprofen for pain. Do not take Aspirin.  Drink enough water and fluids to keep your urine clear or pale yellow.  For sore throat: ? Gargle with 8 oz of salt water ( tsp of salt per 1 qt of water) as often as every 1-2 hours to soothe your throat.  Gargle liquid benadryl.    For sore throat try using a honey-based tea. Use 3 teaspoons of honey with juice squeezed from half lemon. Place shaved pieces of ginger into 1/2-1 cup of water and warm over stove top. Then mix the ingredients and repeat every 4 hours as needed.  Cough Syrup Recipe: Sweet Lemon & Honey Thyme  Ingredients . a handful of fresh thyme sprigs   . 1 pint of water (2 cups)  . 1/2 cup honey (raw is best, but regular will do)  . 1/2 lemon chopped Instructions 1. Place the lemon in the pint jar and cover with the honey. The honey will macerate the lemons and draw out liquids which taste so delicious! 2. Meanwhile, toss the thyme leaves into a saucepan and cover them with the water. 3. Bring the water to a gentle simmer and reduce it to half, about a cup of tea. 4. When the tea is reduced and cooled a bit, strain the sprigs & leaves, add it into the pint jar and stir it well. 5. Give it a  shake and use a spoonful as needed. 6. Store your homemade cough syrup in the refrigerator for about a month.  What causes a cough?  In adults, common causes of a cough include: ?An infection of the airways or lungs (such as the common cold) ?Postnasal drip - Postnasal drip is when mucus from the nose drips down or flows along the back of the throat. Postnasal drip can happen when people have: .A cold .Allergies .A sinus infection - The sinuses are hollow areas in the bones of the face that open into the nose. ?Lung conditions, like asthma and chronic obstructive pulmonary disease (COPD) - Both of these conditions can make it hard to breathe. COPD is usually caused by smoking. ?Acid reflux - Acid reflux is when the acid that is normally in your stomach backs up into your esophagus (the tube that carries food from your mouth to your stomach). ?A side effect from blood pressure medicines called "ACE inhibitors" ?Smoking cigarettes  Is there anything I can do on my own to get rid of my cough?  Yes. To help get rid of your cough, you can: ?Use a humidifier in your bedroom ?Use an over-the-counter cough medicine, or suck on cough drops or hard candy ?Stop smoking, if you smoke ?If you have allergies, avoid the things you are allergic to (like  pollen, dust, animals, or mold) If you have acid reflux, your doctor or nurse will tell you which lifestyle changes can help reduce symptoms.   Thank you for coming in today. I hope you feel we met your needs.  Feel free to call PCP if you have any questions or further requests.  Please consider signing up for MyChart if you do not already have it, as this is a great way to communicate with me.  Best,  [employer], PA-C

## 2021-09-25 ENCOUNTER — Emergency Department (HOSPITAL_COMMUNITY)
Admission: EM | Admit: 2021-09-25 | Discharge: 2021-09-26 | Disposition: A | Discharge status: Home / Self Care | Payer: 59 | Attending: Emergency Medicine | Admitting: Emergency Medicine

## 2021-09-25 ENCOUNTER — Emergency Department (HOSPITAL_COMMUNITY): Payer: 59

## 2021-09-25 ENCOUNTER — Other Ambulatory Visit: Payer: Self-pay

## 2021-09-25 ENCOUNTER — Encounter (HOSPITAL_COMMUNITY): Payer: Self-pay | Admitting: Emergency Medicine

## 2021-09-25 DIAGNOSIS — R072 Precordial pain: Secondary | ICD-10-CM | POA: Insufficient documentation

## 2021-09-25 LAB — CBC
HCT: 46.6 % (ref 39.0–52.0)
Hemoglobin: 15.5 g/dL (ref 13.0–17.0)
MCH: 29.4 pg (ref 26.0–34.0)
MCHC: 33.3 g/dL (ref 30.0–36.0)
MCV: 88.3 fL (ref 80.0–100.0)
Platelets: 257 10*3/uL (ref 150–400)
RBC: 5.28 MIL/uL (ref 4.22–5.81)
RDW: 12.5 % (ref 11.5–15.5)
WBC: 6.7 10*3/uL (ref 4.0–10.5)
nRBC: 0 % (ref 0.0–0.2)

## 2021-09-25 LAB — BASIC METABOLIC PANEL
Anion gap: 8 (ref 5–15)
BUN: 12 mg/dL (ref 8–23)
CO2: 24 mmol/L (ref 22–32)
Calcium: 8.9 mg/dL (ref 8.9–10.3)
Chloride: 107 mmol/L (ref 98–111)
Creatinine, Ser: 0.98 mg/dL (ref 0.61–1.24)
GFR, Estimated: >60 mL/min (ref >60)
Glucose, Bld: 106 mg/dL — ABNORMAL HIGH (ref 70–99)
Potassium: 3.9 mmol/L (ref 3.5–5.1)
Sodium: 139 mmol/L (ref 135–145)

## 2021-09-25 LAB — TROPONIN I (HIGH SENSITIVITY)
Troponin I (High Sensitivity): 5 ng/L (ref <18)
Troponin I (High Sensitivity): 5 ng/L (ref <18)

## 2021-09-25 NOTE — ED Triage Notes (Signed)
Patient here with complaint of intermittent chest pain that started approximately one month ago. Pain occurs at different times of day but is worst at night. Patient is alert, oriented, ambulatory, speaking in complete sentences at this time.

## 2021-09-25 NOTE — ED Provider Triage Note (Signed)
Emergency Medicine Provider Triage Evaluation Note  Jim Jim [name] , Jim [name] 62 y.o. male  was evaluated in triage.  Pt complains of intermittent chest pain onset 1 month.  Notes that his chest pain is intermittently exacerbated with exertion.  Feels like Jim [name] heaviness sensation.  Has associated shortness of breath.  Denies past medical history of MI or stents.  Was evaluated by his primary care provider and sent here to the emergency department for further evaluation of his symptoms.  Also notes that his chest pain is worse when he lays flat.  Review of Systems  Positive: As per HPI above Negative:  Physical Exam  BP (!) 143/80   Pulse 68   Temp 98.1 F (36.7 C) (Oral)   Resp 18   SpO2 98%  Gen:   Awake, no distress   Resp:  Normal effort  MSK:   Moves extremities without difficulty  Other:  No chest wall tenderness to palpation  Medical Decision Making  Medically screening exam initiated at 6:46 PM.  Appropriate orders placed.  Jim Jim [name] was informed that the remainder of the evaluation will be completed by another provider, this initial triage assessment does not replace that evaluation, and the importance of remaining in the ED until their evaluation is complete.  Work-up initiated   Jim Koval A, PA-C 09/25/21 1847

## 2021-09-26 ENCOUNTER — Encounter (HOSPITAL_COMMUNITY): Payer: Self-pay | Admitting: Emergency Medicine

## 2021-09-26 MED ORDER — ALUM & MAG HYDROXIDE-SIMETH 200-200-20 MG/5ML PO SUSP
30.0000 mL | Freq: Once | ORAL | Status: AC
  Administered 2021-09-26: 30 mL via ORAL
  Filled 2021-09-26: qty 30

## 2021-09-26 MED ORDER — OMEPRAZOLE 20 MG PO CPDR: 20.0000 mg | DELAYED_RELEASE_CAPSULE | Freq: Every day | ORAL | 0 refills | Status: AC

## 2021-09-26 NOTE — ED Notes (Signed)
E-signature pad unavailable at time of pt discharge. This RN discussed discharge materials with pt and answered all pt questions. Pt stated understanding of discharge material. ? ?

## 2021-09-26 NOTE — ED Provider Notes (Signed)
[facility] EMERGENCY DEPARTMENT Provider Note   CSN: [id number] Arrival date & time: 09/25/21  1659     History  Chief Complaint  Patient presents with   Chest Pain    Jim [name] is a 61 y.o. male.  The history is provided by the patient.  Chest Pain Pain location:  Substernal area Pain quality: dull   Pain radiates to:  Does not radiate Pain severity:  Moderate Onset quality:  Gradual Duration:  1 month Timing:  Intermittent Progression:  Waxing and waning Chronicity:  New Context: at rest   Context comment:  Worst at night Relieved by:  Nothing Worsened by:  Nothing Ineffective treatments:  None tried Associated symptoms: no anorexia, no fever, no lower extremity edema, no nausea, no palpitations, no shortness of breath and no vomiting   Risk factors: male sex   Risk factors: no aortic disease   Patient with non-radiating chest pain.  Worst at night.  Happens mostly when seated on couch, he stays there until it passes.  Denies exertional component.  No associated symptoms.  Seen by PMD who is setting him up with cardiology.       Home Medications Prior to Admission medications   Medication Sig Start Date End Date Taking? Authorizing Provider  benzonatate (TESSALON) 100 MG capsule Take 1-2 capsules (100-200 mg total) by mouth 3 (three) times daily as needed for cough. 04/06/18   McVey, Gelene Mink, PA-C  chlorpheniramine-HYDROcodone (TUSSIONEX PENNKINETIC ER) 10-8 MG/5ML SUER Take 5 mLs by mouth every 12 (twelve) hours as needed for cough. 04/06/18   McVey, Gelene Mink, PA-C  lisinopril (PRINIVIL,ZESTRIL) 5 MG tablet TAKE 1 TABLET BY MOUTH ONCE DAILY 04/06/18   [name], MD  triamcinolone cream (KENALOG) 0.1 % Apply 1 application topically 2 (two) times daily. 11/07/17   [name], MD      Allergies    Patient has no known allergies.    Review of Systems   Review of Systems  Constitutional:  Negative for  fever.  HENT:  Negative for facial swelling.   Eyes:  Negative for redness.  Respiratory:  Negative for shortness of breath.   Cardiovascular:  Positive for chest pain. Negative for palpitations.  Gastrointestinal:  Negative for anorexia, nausea and vomiting.  Musculoskeletal:  Negative for neck pain.  All other systems reviewed and are negative.   Physical Exam Updated Vital Signs BP (!) 133/92   Pulse 67   Temp 98.1 F (36.7 C) (Oral)   Resp 16   SpO2 99%  Physical Exam Vitals and nursing note reviewed.  Constitutional:      General: He is not in acute distress.    Appearance: He is well-developed. He is not diaphoretic.  HENT:     Head: Normocephalic and atraumatic.     Nose: Nose normal.  Eyes:     Extraocular Movements: Extraocular movements intact.     Conjunctiva/sclera: Conjunctivae normal.     Comments: Normal appearance  Cardiovascular:     Rate and Rhythm: Normal rate and regular rhythm.     Pulses: Normal pulses.     Heart sounds: Normal heart sounds.  Pulmonary:     Effort: Pulmonary effort is normal. No respiratory distress.     Breath sounds: Normal breath sounds.  Abdominal:     General: Bowel sounds are normal. There is no distension.     Palpations: Abdomen is soft. There is no mass.     Tenderness: There is no  abdominal tenderness. There is no guarding or rebound.  Genitourinary:    Comments: No CVA tenderness Musculoskeletal:        General: Normal range of motion.     Cervical back: Normal range of motion and neck supple.  Skin:    General: Skin is warm and dry.     Capillary Refill: Capillary refill takes less than 2 seconds.     Findings: No rash.  Neurological:     General: No focal deficit present.     Mental Status: He is alert and oriented to person, place, and time.  Psychiatric:        Mood and Affect: Mood normal.        Behavior: Behavior normal.     ED Results / Procedures / Treatments   Labs (all labs ordered are listed,  but only abnormal results are displayed) Results for orders placed or performed during the hospital encounter of 71/24/58  Basic metabolic panel  Result Value Ref Range   Sodium 139 135 - 145 mmol/L   Potassium 3.9 3.5 - 5.1 mmol/L   Chloride 107 98 - 111 mmol/L   CO2 24 22 - 32 mmol/L   Glucose, Bld 106 (H) 70 - 99 mg/dL   BUN 12 8 - 23 mg/dL   Creatinine, Ser 0.98 0.61 - 1.24 mg/dL   Calcium 8.9 8.9 - 10.3 mg/dL   GFR, Estimated >60 >60 mL/min   Anion gap 8 5 - 15  CBC  Result Value Ref Range   WBC 6.7 4.0 - 10.5 K/uL   RBC 5.28 4.22 - 5.81 MIL/uL   Hemoglobin 15.5 13.0 - 17.0 g/dL   HCT 46.6 39.0 - 52.0 %   MCV 88.3 80.0 - 100.0 fL   MCH 29.4 26.0 - 34.0 pg   MCHC 33.3 30.0 - 36.0 g/dL   RDW 12.5 11.5 - 15.5 %   Platelets 257 150 - 400 K/uL   nRBC 0.0 0.0 - 0.2 %  Troponin I (High Sensitivity)  Result Value Ref Range   Troponin I (High Sensitivity) 5 <18 ng/L  Troponin I (High Sensitivity)  Result Value Ref Range   Troponin I (High Sensitivity) 5 <18 ng/L   DG Chest 2 View  Result Date: 09/25/2021 CLINICAL DATA:  chest pain EXAM: CHEST - 2 VIEW COMPARISON:  None Available. FINDINGS: The heart and mediastinal contours are within normal limits. Aortic calcification. No focal consolidation. No pulmonary edema. No pleural effusion. No pneumothorax. No acute osseous abnormality. IMPRESSION: 1. No active cardiopulmonary disease. 2.  Aortic Atherosclerosis (ICD10-I70.0). Electronically Signed   By: Iven Finn M.D.   On: 09/25/2021 19:16     EKG EKG Interpretation  Date/Time:  Friday September 25 2021 18:28:57 EDT Ventricular Rate:  57 PR Interval:  136 QRS Duration: 94 QT Interval:  400 QTC Calculation: 389 R Axis:   60 Text Interpretation: Sinus bradycardia Confirmed by Dory Horn) on 09/25/2021 11:53:22 PM  Radiology DG Chest 2 View  Result Date: 09/25/2021 CLINICAL DATA:  chest pain EXAM: CHEST - 2 VIEW COMPARISON:  None Available. FINDINGS: The heart and  mediastinal contours are within normal limits. Aortic calcification. No focal consolidation. No pulmonary edema. No pleural effusion. No pneumothorax. No acute osseous abnormality. IMPRESSION: 1. No active cardiopulmonary disease. 2.  Aortic Atherosclerosis (ICD10-I70.0). Electronically Signed   By: Iven Finn M.D.   On: 09/25/2021 19:16    Procedures Procedures    Medications Ordered in ED Medications  alum & mag  hydroxide-simeth (MAALOX/MYLANTA) 200-200-20 MG/5ML suspension 30 mL (30 mLs Oral Given 09/26/21 0038)    ED Course/ Medical Decision Making/ A&P                           Medical Decision Making Chest pain at rest x 1 month   Amount and/or Complexity of Data Reviewed External Data Reviewed: notes.    Details: previous notes reviewed Labs: ordered.    Details: all labs reviewed: 2 negative troponins both 5.  Normal CBC, normal chemistry normal sodium 139 and potassium 3.9 normal creatinine .19 Radiology: ordered and independent interpretation performed.    Details: negative CXR for acute findings. ECG/medicine tests: ordered and independent interpretation performed. Decision-making details documented in ED Course.  Risk OTC drugs. Risk Details: Symptoms atypical for MI.  Ruled out for MI in the ED with negative EKG and 2 negative troponins.  Heart score is 2, low risk for MACE.  Patient is scheduled for cardiology follow up.  Symptoms today most consistent with GERD.  Will start PPI.  I do not believe this is a PE.  No leg pain or SOB.  Strict return precautions given.      Final Clinical Impression(s) / ED Diagnoses Final diagnoses:  None  Return for intractable cough, coughing up blood, fevers > 100.4 unrelieved by medication, shortness of breath, intractable vomiting, chest pain, shortness of breath, weakness, numbness, changes in speech, facial asymmetry, abdominal pain, passing out, Inability to tolerate liquids or food, cough, altered mental status or any  concerns. No signs of systemic illness or infection. The patient is nontoxic-appearing on exam and vital signs are within normal limits.  I have reviewed the triage vital signs and the nursing notes. Pertinent labs & imaging results that were available during my care of the patient were reviewed by me and considered in my medical decision making (see chart for details). After history, exam, and medical workup I feel the patient has been appropriately medically screened and is safe for discharge home. Pertinent diagnoses were discussed with the patient. Patient was given return precautions  Rx / DC Orders ED Discharge Orders     None         Marleah Beever, MD 09/26/21 8563

## 2021-10-16 ENCOUNTER — Other Ambulatory Visit: Payer: Self-pay | Admitting: Cardiology

## 2021-10-16 ENCOUNTER — Other Ambulatory Visit (HOSPITAL_COMMUNITY): Payer: Self-pay | Admitting: Cardiology

## 2021-10-16 DIAGNOSIS — R079 Chest pain, unspecified: Secondary | ICD-10-CM

## 2021-10-23 ENCOUNTER — Encounter: Payer: Self-pay | Admitting: *Deleted

## 2021-10-23 ENCOUNTER — Encounter (HOSPITAL_COMMUNITY)
Admission: RE | Admit: 2021-10-23 | Discharge: 2021-10-23 | Disposition: A | Discharge status: Home / Self Care | Payer: 59 | Source: Ambulatory Visit | Attending: Cardiology | Admitting: Cardiology

## 2021-10-23 ENCOUNTER — Other Ambulatory Visit (HOSPITAL_COMMUNITY): Payer: Self-pay | Admitting: Cardiology

## 2021-10-23 DIAGNOSIS — R079 Chest pain, unspecified: Secondary | ICD-10-CM

## 2021-10-23 MED ORDER — TECHNETIUM TC 99M TETROFOSMIN IV KIT
32.3000 | PACK | Freq: Once | INTRAVENOUS | Status: AC | PRN
  Administered 2021-10-23: 32.3 via INTRAVENOUS

## 2021-10-23 MED ORDER — TECHNETIUM TC 99M TETROFOSMIN IV KIT
10.9000 | PACK | Freq: Once | INTRAVENOUS | Status: AC | PRN
  Administered 2021-10-23: 10.9 via INTRAVENOUS

## 2021-10-23 MED ORDER — REGADENOSON 0.4 MG/5ML IV SOLN
INTRAVENOUS | Status: AC
  Administered 2021-10-23: 0.4 mg
  Filled 2021-10-23: qty 5

## 2022-03-29 ENCOUNTER — Encounter (HOSPITAL_BASED_OUTPATIENT_CLINIC_OR_DEPARTMENT_OTHER): Payer: Self-pay

## 2022-03-29 DIAGNOSIS — G4733 Obstructive sleep apnea (adult) (pediatric): Secondary | ICD-10-CM

## 2022-03-29 DIAGNOSIS — R0683 Snoring: Secondary | ICD-10-CM

## 2022-03-29 DIAGNOSIS — R0681 Apnea, not elsewhere classified: Secondary | ICD-10-CM

## 2022-03-29 DIAGNOSIS — I10 Essential (primary) hypertension: Secondary | ICD-10-CM

## 2022-03-29 DIAGNOSIS — R5383 Other fatigue: Secondary | ICD-10-CM

## 2022-08-20 IMAGING — DX DG CHEST 2V
2 series · 2 of 2 positions shown · non-contrast
Comparison: None Available.

CLINICAL DATA: chest pain

EXAM:
CHEST - 2 VIEW

[chest pa]
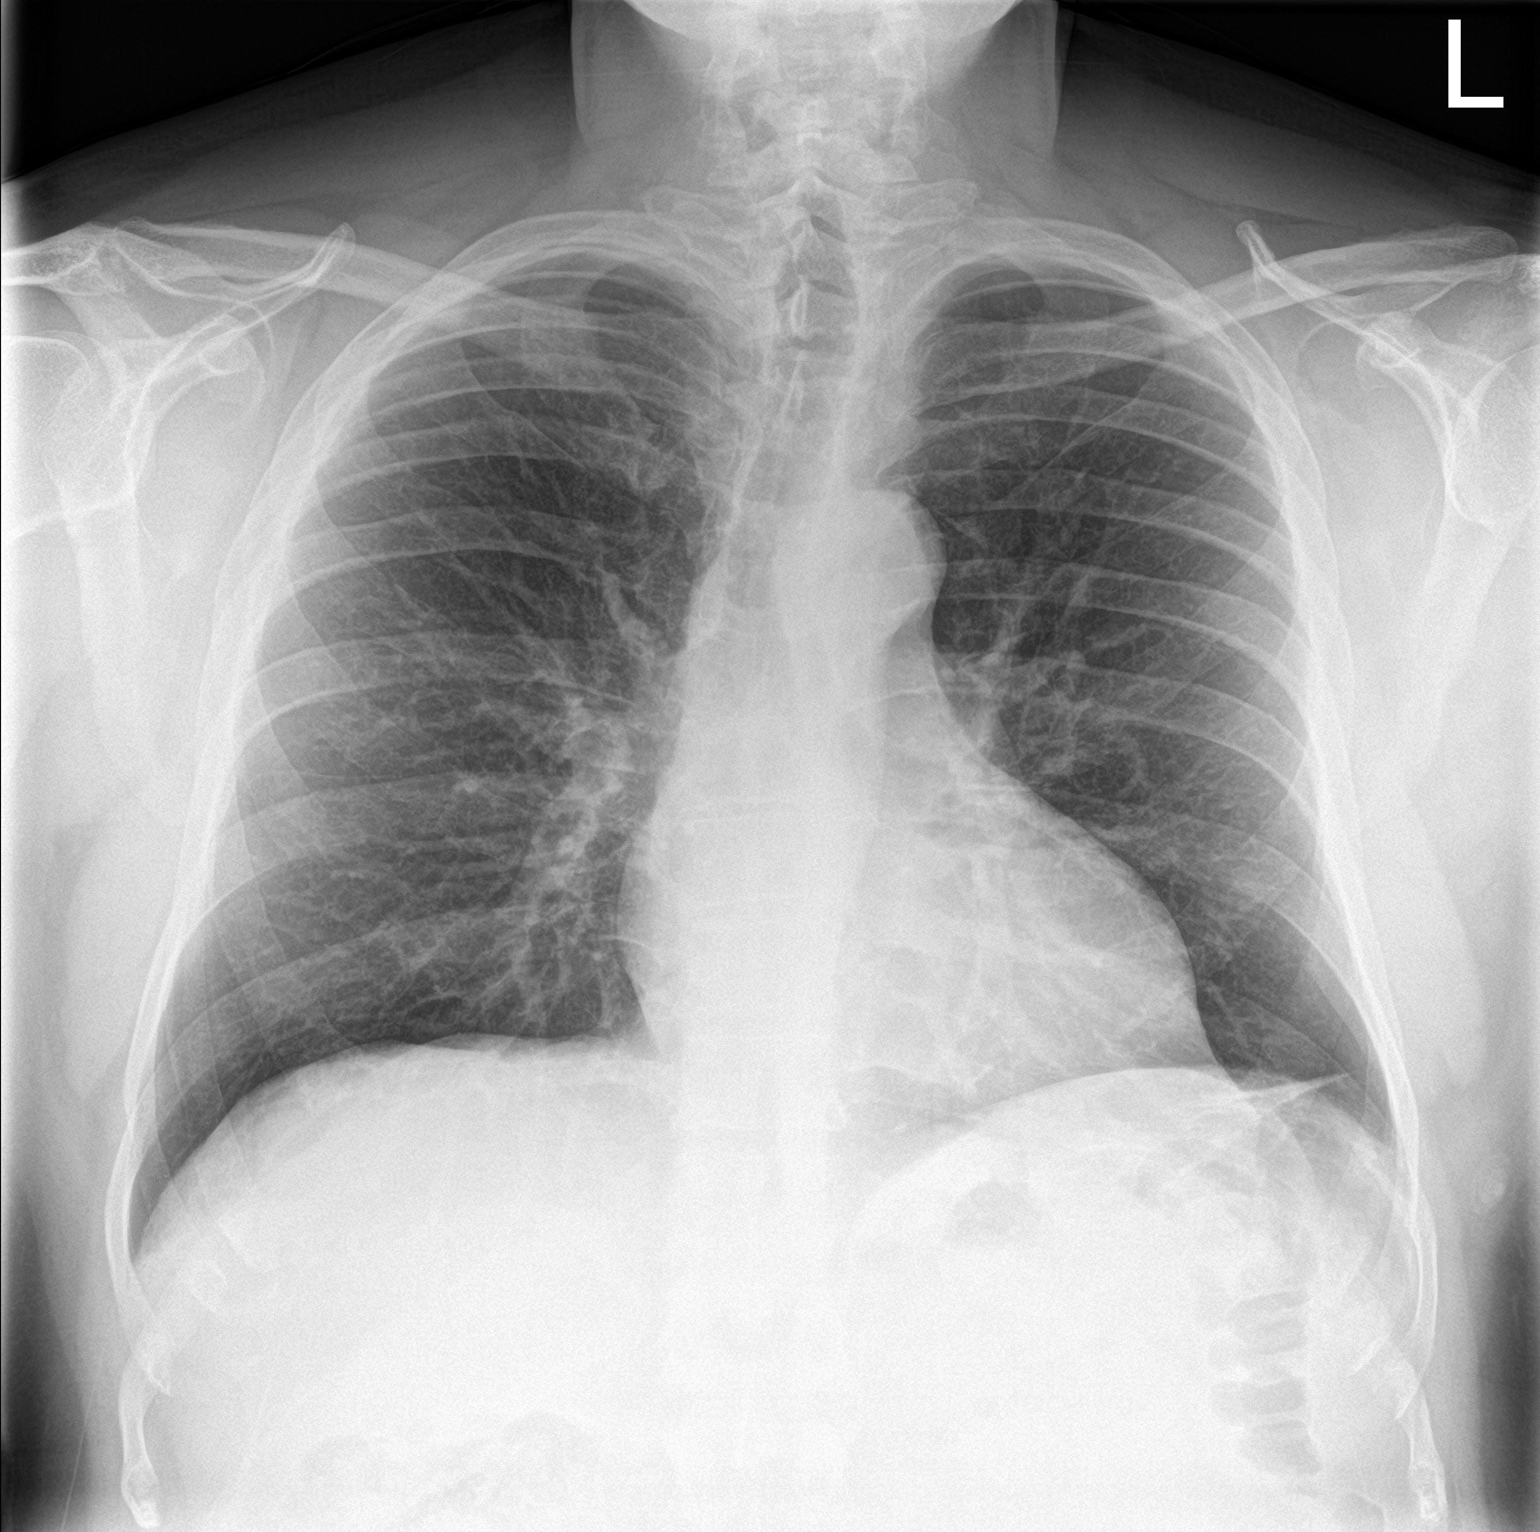

[chest lat]
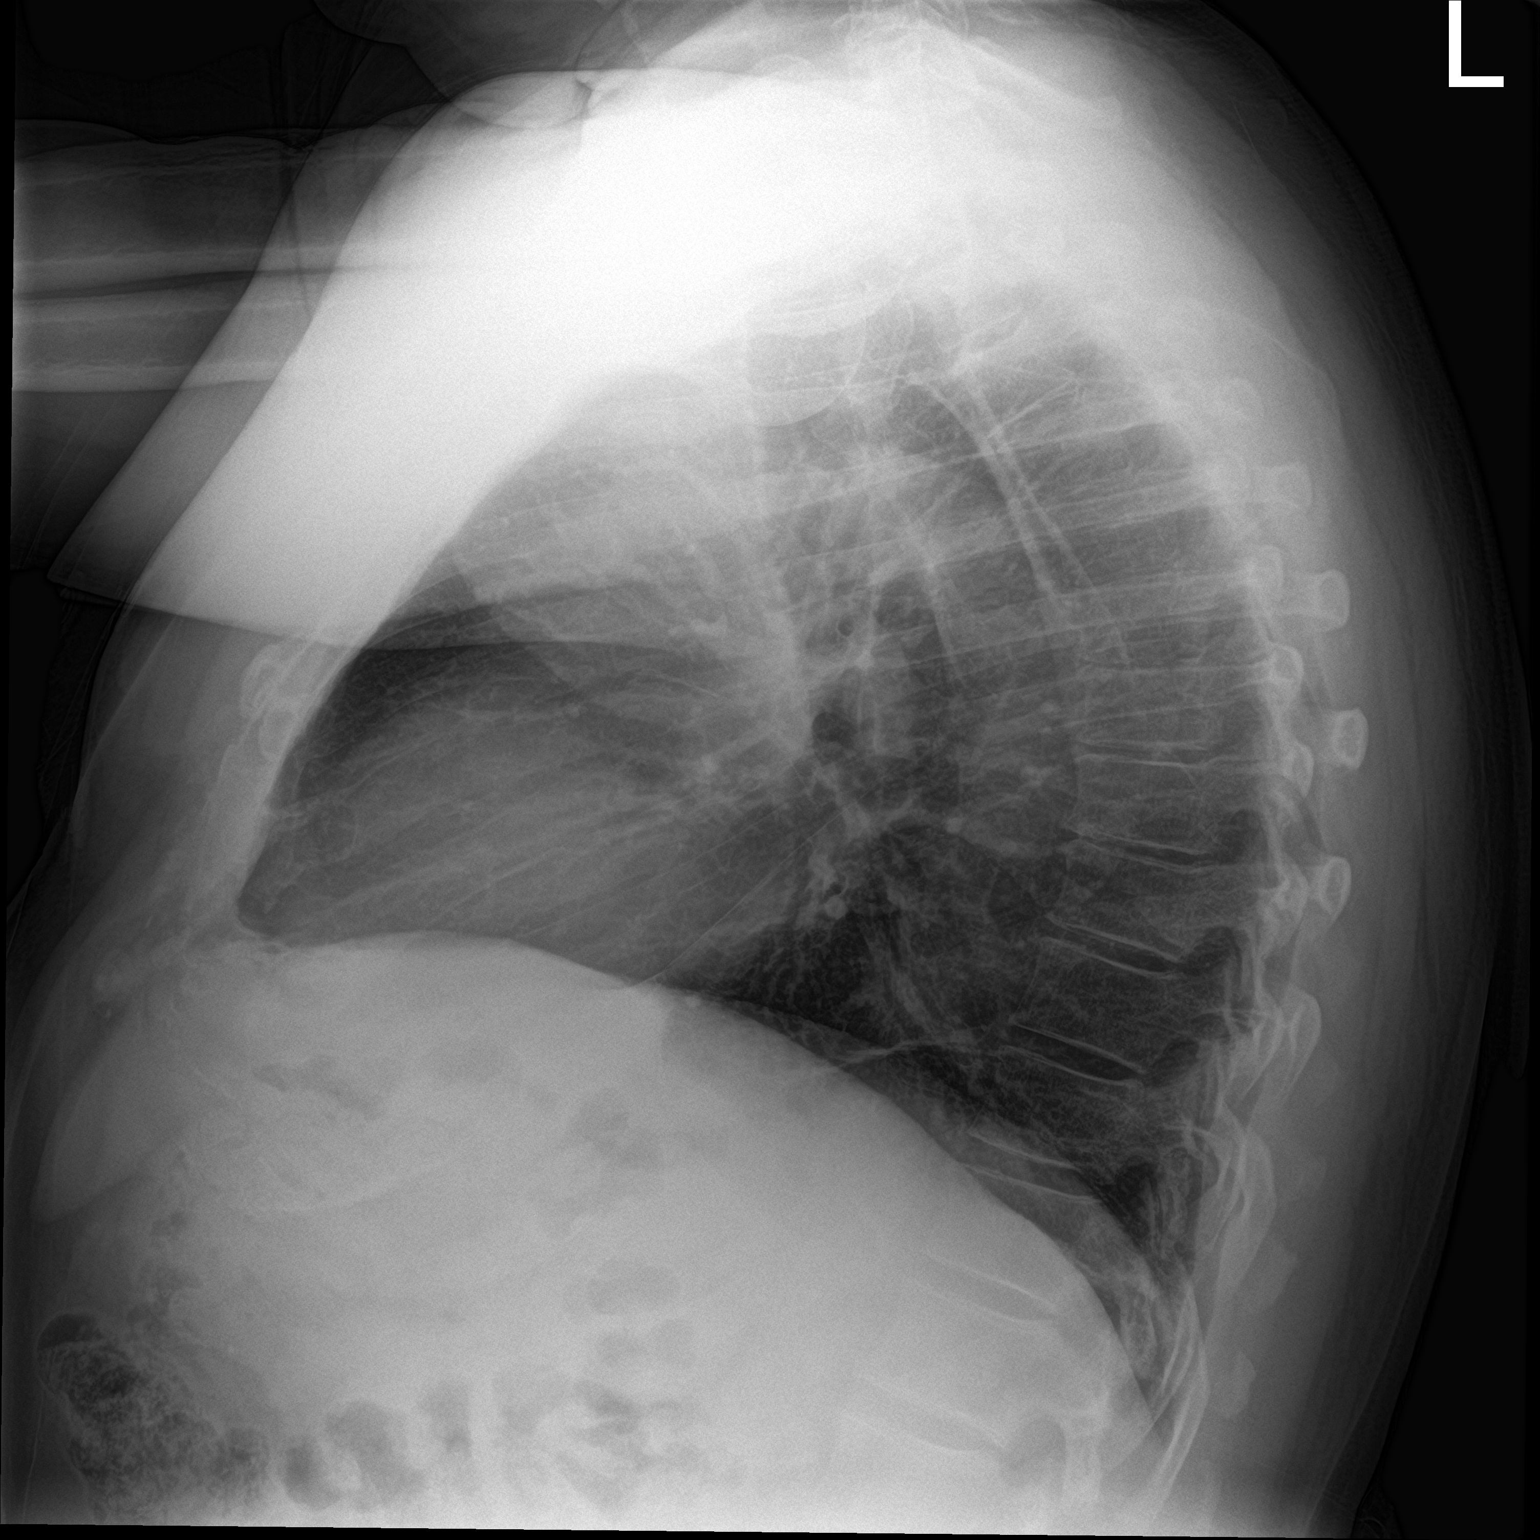

[2 of 2 positions shown; findings below may reference images not displayed]

FINDINGS: The heart and mediastinal contours are within normal limits. Aortic
calcification.

No focal consolidation. No pulmonary edema. No pleural effusion. No
pneumothorax.

No acute osseous abnormality.
IMPRESSION: 1. No active cardiopulmonary disease.
2.  Aortic Atherosclerosis ([id number]).
